# Patient Record
Sex: Male | Born: 1940
Health system: Southern US, Community
[De-identification: ages and names within clinical notes are randomized; demographics above are authoritative.]

## PROBLEM LIST (undated history)

## (undated) DIAGNOSIS — I10 Essential (primary) hypertension: Secondary | ICD-10-CM

## (undated) DIAGNOSIS — N189 Chronic kidney disease, unspecified: Secondary | ICD-10-CM

## (undated) DIAGNOSIS — R001 Bradycardia, unspecified: Secondary | ICD-10-CM

## (undated) DIAGNOSIS — E785 Hyperlipidemia, unspecified: Secondary | ICD-10-CM

## (undated) DIAGNOSIS — M1990 Unspecified osteoarthritis, unspecified site: Secondary | ICD-10-CM

## (undated) DIAGNOSIS — G4730 Sleep apnea, unspecified: Secondary | ICD-10-CM

## (undated) HISTORY — PX: OTHER SURGICAL HISTORY: SHX169

## (undated) HISTORY — PX: COLONOSCOPY: SHX174

---

## 2013-01-11 DIAGNOSIS — I10 Essential (primary) hypertension: Secondary | ICD-10-CM | POA: Insufficient documentation

## 2013-01-11 DIAGNOSIS — E785 Hyperlipidemia, unspecified: Secondary | ICD-10-CM | POA: Insufficient documentation

## 2013-05-13 DIAGNOSIS — G4733 Obstructive sleep apnea (adult) (pediatric): Secondary | ICD-10-CM | POA: Insufficient documentation

## 2013-12-10 DIAGNOSIS — M545 Low back pain, unspecified: Secondary | ICD-10-CM | POA: Insufficient documentation

## 2014-01-27 DIAGNOSIS — L0212 Furuncle of neck: Secondary | ICD-10-CM | POA: Insufficient documentation

## 2014-02-06 DIAGNOSIS — N289 Disorder of kidney and ureter, unspecified: Secondary | ICD-10-CM | POA: Insufficient documentation

## 2016-03-01 DIAGNOSIS — G4762 Sleep related leg cramps: Secondary | ICD-10-CM | POA: Insufficient documentation

## 2016-07-15 DIAGNOSIS — Z125 Encounter for screening for malignant neoplasm of prostate: Secondary | ICD-10-CM | POA: Insufficient documentation

## 2016-07-15 DIAGNOSIS — M25511 Pain in right shoulder: Secondary | ICD-10-CM

## 2016-07-15 DIAGNOSIS — G8929 Other chronic pain: Secondary | ICD-10-CM | POA: Insufficient documentation

## 2016-07-15 DIAGNOSIS — Z79899 Other long term (current) drug therapy: Secondary | ICD-10-CM | POA: Insufficient documentation

## 2016-09-02 DIAGNOSIS — Z6825 Body mass index (BMI) 25.0-25.9, adult: Secondary | ICD-10-CM | POA: Diagnosis not present

## 2016-09-02 DIAGNOSIS — M7521 Bicipital tendinitis, right shoulder: Secondary | ICD-10-CM | POA: Diagnosis not present

## 2016-09-02 DIAGNOSIS — M19011 Primary osteoarthritis, right shoulder: Secondary | ICD-10-CM | POA: Diagnosis not present

## 2016-09-14 DIAGNOSIS — Z125 Encounter for screening for malignant neoplasm of prostate: Secondary | ICD-10-CM | POA: Diagnosis not present

## 2016-09-14 DIAGNOSIS — E7800 Pure hypercholesterolemia, unspecified: Secondary | ICD-10-CM | POA: Diagnosis not present

## 2016-09-14 DIAGNOSIS — M545 Low back pain: Secondary | ICD-10-CM | POA: Diagnosis not present

## 2016-09-14 DIAGNOSIS — Z79899 Other long term (current) drug therapy: Secondary | ICD-10-CM | POA: Diagnosis not present

## 2016-09-14 DIAGNOSIS — Z6824 Body mass index (BMI) 24.0-24.9, adult: Secondary | ICD-10-CM | POA: Diagnosis not present

## 2016-09-14 DIAGNOSIS — G4733 Obstructive sleep apnea (adult) (pediatric): Secondary | ICD-10-CM | POA: Diagnosis not present

## 2016-09-14 DIAGNOSIS — I10 Essential (primary) hypertension: Secondary | ICD-10-CM | POA: Diagnosis not present

## 2016-11-21 DIAGNOSIS — M25511 Pain in right shoulder: Secondary | ICD-10-CM | POA: Diagnosis not present

## 2016-11-21 DIAGNOSIS — E785 Hyperlipidemia, unspecified: Secondary | ICD-10-CM | POA: Diagnosis not present

## 2016-11-21 DIAGNOSIS — G4733 Obstructive sleep apnea (adult) (pediatric): Secondary | ICD-10-CM | POA: Diagnosis not present

## 2016-11-21 DIAGNOSIS — G8929 Other chronic pain: Secondary | ICD-10-CM | POA: Diagnosis not present

## 2016-11-21 DIAGNOSIS — Z6824 Body mass index (BMI) 24.0-24.9, adult: Secondary | ICD-10-CM | POA: Diagnosis not present

## 2016-11-21 DIAGNOSIS — I10 Essential (primary) hypertension: Secondary | ICD-10-CM | POA: Diagnosis not present

## 2016-11-21 DIAGNOSIS — N289 Disorder of kidney and ureter, unspecified: Secondary | ICD-10-CM | POA: Diagnosis not present

## 2016-12-07 DIAGNOSIS — Z6824 Body mass index (BMI) 24.0-24.9, adult: Secondary | ICD-10-CM | POA: Diagnosis not present

## 2016-12-07 DIAGNOSIS — M19011 Primary osteoarthritis, right shoulder: Secondary | ICD-10-CM | POA: Diagnosis not present

## 2016-12-07 DIAGNOSIS — M19012 Primary osteoarthritis, left shoulder: Secondary | ICD-10-CM | POA: Diagnosis not present

## 2016-12-07 DIAGNOSIS — M25512 Pain in left shoulder: Secondary | ICD-10-CM | POA: Diagnosis not present

## 2016-12-07 DIAGNOSIS — G8929 Other chronic pain: Secondary | ICD-10-CM | POA: Diagnosis not present

## 2016-12-07 DIAGNOSIS — M25511 Pain in right shoulder: Secondary | ICD-10-CM | POA: Diagnosis not present

## 2016-12-09 DIAGNOSIS — M7521 Bicipital tendinitis, right shoulder: Secondary | ICD-10-CM | POA: Diagnosis not present

## 2016-12-09 DIAGNOSIS — Z6824 Body mass index (BMI) 24.0-24.9, adult: Secondary | ICD-10-CM | POA: Diagnosis not present

## 2016-12-09 DIAGNOSIS — M19011 Primary osteoarthritis, right shoulder: Secondary | ICD-10-CM | POA: Diagnosis not present

## 2016-12-19 DIAGNOSIS — H2513 Age-related nuclear cataract, bilateral: Secondary | ICD-10-CM | POA: Diagnosis not present

## 2017-02-07 DIAGNOSIS — G4762 Sleep related leg cramps: Secondary | ICD-10-CM | POA: Diagnosis not present

## 2017-02-07 DIAGNOSIS — Z6824 Body mass index (BMI) 24.0-24.9, adult: Secondary | ICD-10-CM | POA: Diagnosis not present

## 2017-03-27 DIAGNOSIS — R42 Dizziness and giddiness: Secondary | ICD-10-CM | POA: Diagnosis not present

## 2017-03-27 DIAGNOSIS — R001 Bradycardia, unspecified: Secondary | ICD-10-CM | POA: Insufficient documentation

## 2017-03-27 DIAGNOSIS — Z6824 Body mass index (BMI) 24.0-24.9, adult: Secondary | ICD-10-CM | POA: Diagnosis not present

## 2017-03-27 DIAGNOSIS — E785 Hyperlipidemia, unspecified: Secondary | ICD-10-CM | POA: Diagnosis not present

## 2017-03-27 DIAGNOSIS — N289 Disorder of kidney and ureter, unspecified: Secondary | ICD-10-CM | POA: Diagnosis not present

## 2017-03-27 DIAGNOSIS — M25519 Pain in unspecified shoulder: Secondary | ICD-10-CM | POA: Diagnosis not present

## 2017-03-27 DIAGNOSIS — G4733 Obstructive sleep apnea (adult) (pediatric): Secondary | ICD-10-CM | POA: Diagnosis not present

## 2017-03-27 DIAGNOSIS — I10 Essential (primary) hypertension: Secondary | ICD-10-CM | POA: Diagnosis not present

## 2017-04-05 DIAGNOSIS — R42 Dizziness and giddiness: Secondary | ICD-10-CM | POA: Diagnosis not present

## 2017-04-05 DIAGNOSIS — Z6824 Body mass index (BMI) 24.0-24.9, adult: Secondary | ICD-10-CM | POA: Diagnosis not present

## 2017-05-01 DIAGNOSIS — Z6825 Body mass index (BMI) 25.0-25.9, adult: Secondary | ICD-10-CM | POA: Diagnosis not present

## 2017-05-01 DIAGNOSIS — E7800 Pure hypercholesterolemia, unspecified: Secondary | ICD-10-CM | POA: Diagnosis not present

## 2017-05-01 DIAGNOSIS — Z79899 Other long term (current) drug therapy: Secondary | ICD-10-CM | POA: Diagnosis not present

## 2017-05-01 DIAGNOSIS — G4733 Obstructive sleep apnea (adult) (pediatric): Secondary | ICD-10-CM | POA: Diagnosis not present

## 2017-05-01 DIAGNOSIS — I10 Essential (primary) hypertension: Secondary | ICD-10-CM | POA: Diagnosis not present

## 2017-05-01 DIAGNOSIS — M545 Low back pain: Secondary | ICD-10-CM | POA: Diagnosis not present

## 2017-05-15 DIAGNOSIS — Z136 Encounter for screening for cardiovascular disorders: Secondary | ICD-10-CM | POA: Diagnosis not present

## 2017-05-15 DIAGNOSIS — Z125 Encounter for screening for malignant neoplasm of prostate: Secondary | ICD-10-CM | POA: Diagnosis not present

## 2017-05-15 DIAGNOSIS — Z9181 History of falling: Secondary | ICD-10-CM | POA: Diagnosis not present

## 2017-05-15 DIAGNOSIS — Z139 Encounter for screening, unspecified: Secondary | ICD-10-CM | POA: Diagnosis not present

## 2017-05-15 DIAGNOSIS — Z1389 Encounter for screening for other disorder: Secondary | ICD-10-CM | POA: Diagnosis not present

## 2017-05-15 DIAGNOSIS — Z0000 Encounter for general adult medical examination without abnormal findings: Secondary | ICD-10-CM | POA: Diagnosis not present

## 2017-05-15 DIAGNOSIS — Z23 Encounter for immunization: Secondary | ICD-10-CM | POA: Diagnosis not present

## 2017-05-15 DIAGNOSIS — E785 Hyperlipidemia, unspecified: Secondary | ICD-10-CM | POA: Diagnosis not present

## 2017-06-14 DIAGNOSIS — M25512 Pain in left shoulder: Secondary | ICD-10-CM | POA: Diagnosis not present

## 2017-06-14 DIAGNOSIS — M19012 Primary osteoarthritis, left shoulder: Secondary | ICD-10-CM | POA: Diagnosis not present

## 2017-06-14 DIAGNOSIS — M25511 Pain in right shoulder: Secondary | ICD-10-CM | POA: Diagnosis not present

## 2017-06-14 DIAGNOSIS — Z6824 Body mass index (BMI) 24.0-24.9, adult: Secondary | ICD-10-CM | POA: Diagnosis not present

## 2017-06-14 DIAGNOSIS — G8929 Other chronic pain: Secondary | ICD-10-CM | POA: Diagnosis not present

## 2017-06-16 DIAGNOSIS — M19012 Primary osteoarthritis, left shoulder: Secondary | ICD-10-CM | POA: Diagnosis not present

## 2017-06-16 DIAGNOSIS — M25512 Pain in left shoulder: Secondary | ICD-10-CM | POA: Diagnosis not present

## 2017-07-05 DIAGNOSIS — G4733 Obstructive sleep apnea (adult) (pediatric): Secondary | ICD-10-CM | POA: Diagnosis not present

## 2017-07-05 DIAGNOSIS — Z6825 Body mass index (BMI) 25.0-25.9, adult: Secondary | ICD-10-CM | POA: Diagnosis not present

## 2017-07-05 DIAGNOSIS — R001 Bradycardia, unspecified: Secondary | ICD-10-CM | POA: Diagnosis not present

## 2017-07-05 DIAGNOSIS — N289 Disorder of kidney and ureter, unspecified: Secondary | ICD-10-CM | POA: Diagnosis not present

## 2017-07-05 DIAGNOSIS — E785 Hyperlipidemia, unspecified: Secondary | ICD-10-CM | POA: Diagnosis not present

## 2017-07-05 DIAGNOSIS — I10 Essential (primary) hypertension: Secondary | ICD-10-CM | POA: Diagnosis not present

## 2017-07-19 DIAGNOSIS — M25512 Pain in left shoulder: Secondary | ICD-10-CM | POA: Diagnosis not present

## 2017-07-20 DIAGNOSIS — M19011 Primary osteoarthritis, right shoulder: Secondary | ICD-10-CM | POA: Diagnosis not present

## 2017-07-20 DIAGNOSIS — M19012 Primary osteoarthritis, left shoulder: Secondary | ICD-10-CM | POA: Diagnosis not present

## 2017-08-08 HISTORY — PX: JOINT REPLACEMENT: SHX530

## 2017-08-28 DIAGNOSIS — M19012 Primary osteoarthritis, left shoulder: Secondary | ICD-10-CM | POA: Diagnosis not present

## 2017-08-29 DIAGNOSIS — M19019 Primary osteoarthritis, unspecified shoulder: Secondary | ICD-10-CM | POA: Insufficient documentation

## 2017-08-30 ENCOUNTER — Encounter
Admission: RE | Admit: 2017-08-30 | Discharge: 2017-08-30 | Disposition: A | Discharge status: Home / Self Care | Payer: Medicare Other | Source: Ambulatory Visit | Attending: Orthopedic Surgery | Admitting: Orthopedic Surgery

## 2017-08-30 ENCOUNTER — Other Ambulatory Visit: Payer: Self-pay

## 2017-08-30 DIAGNOSIS — Z01812 Encounter for preprocedural laboratory examination: Secondary | ICD-10-CM | POA: Insufficient documentation

## 2017-08-30 HISTORY — DX: Hyperlipidemia, unspecified: E78.5

## 2017-08-30 HISTORY — DX: Chronic kidney disease, unspecified: N18.9

## 2017-08-30 HISTORY — DX: Bradycardia, unspecified: R00.1

## 2017-08-30 HISTORY — DX: Unspecified osteoarthritis, unspecified site: M19.90

## 2017-08-30 HISTORY — DX: Sleep apnea, unspecified: G47.30

## 2017-08-30 HISTORY — DX: Essential (primary) hypertension: I10

## 2017-08-30 LAB — DIFFERENTIAL
BASOS PCT: 1 %
Basophils Absolute: 0 10*3/uL (ref 0–0.1)
EOS ABS: 0.2 10*3/uL (ref 0–0.7)
EOS PCT: 3 %
Lymphocytes Relative: 18 %
Lymphs Abs: 1.1 10*3/uL (ref 1.0–3.6)
MONOS PCT: 9 %
Monocytes Absolute: 0.5 10*3/uL (ref 0.2–1.0)
Neutro Abs: 4.1 10*3/uL (ref 1.4–6.5)
Neutrophils Relative %: 69 %

## 2017-08-30 LAB — BASIC METABOLIC PANEL
Anion gap: 8 (ref 5–15)
BUN: 15 mg/dL (ref 6–20)
CO2: 26 mmol/L (ref 22–32)
CREATININE: 0.77 mg/dL (ref 0.61–1.24)
Calcium: 9.4 mg/dL (ref 8.9–10.3)
Chloride: 104 mmol/L (ref 101–111)
GFR calc Af Amer: >60 mL/min (ref >60)
GLUCOSE: 119 mg/dL — AB (ref 65–99)
Potassium: 3.9 mmol/L (ref 3.5–5.1)
SODIUM: 138 mmol/L (ref 135–145)

## 2017-08-30 LAB — URINALYSIS, ROUTINE W REFLEX MICROSCOPIC
BILIRUBIN URINE: NEGATIVE
GLUCOSE, UA: 50 mg/dL — AB
Hgb urine dipstick: NEGATIVE
Ketones, ur: NEGATIVE mg/dL
Leukocytes, UA: NEGATIVE
Nitrite: NEGATIVE
PH: 7 (ref 5.0–8.0)
Protein, ur: NEGATIVE mg/dL
SPECIFIC GRAVITY, URINE: 1.009 (ref 1.005–1.030)

## 2017-08-30 LAB — TYPE AND SCREEN
ABO/RH(D): O POS
ANTIBODY SCREEN: NEGATIVE

## 2017-08-30 LAB — SURGICAL PCR SCREEN
MRSA, PCR: NEGATIVE
Staphylococcus aureus: NEGATIVE

## 2017-08-30 LAB — CBC
HCT: 40.7 % (ref 40.0–52.0)
Hemoglobin: 14.1 g/dL (ref 13.0–18.0)
MCH: 31.5 pg (ref 26.0–34.0)
MCHC: 34.6 g/dL (ref 32.0–36.0)
MCV: 91.1 fL (ref 80.0–100.0)
PLATELETS: 177 10*3/uL (ref 150–440)
RBC: 4.47 MIL/uL (ref 4.40–5.90)
RDW: 13.1 % (ref 11.5–14.5)
WBC: 5.8 10*3/uL (ref 3.8–10.6)

## 2017-08-30 LAB — PROTIME-INR
INR: 0.92
PROTHROMBIN TIME: 12.3 s (ref 11.4–15.2)

## 2017-08-30 NOTE — Patient Instructions (Signed)
Your procedure is scheduled on: Monday 09/11/17 Report to DAY SURGERY. 2ND FLOOR MEDICAL MALL ENTRANCE. To find out your arrival time please call [phone] between 1PM - 3PM on Friday 09/08/17.  Remember: Instructions that are not followed completely may result in serious medical risk, up to and including death, or upon the discretion of your surgeon and anesthesiologist your surgery may need to be rescheduled.    __X__ 1. Do not eat anything after midnight the night before your    procedure.  No gum chewing or hard candies.  You may drink clear   liquids up to 2 hours before you are scheduled to arrive at the   hospital for your procedure. Do not drink clear liquids within 2   hours of scheduled arrival to the hospital as this may lead to your   procedure being delayed or rescheduled.       Clear liquids include:   Water or Apple juice without pulp   Clear carbohydrate beverage such as Clearfast or Gatorade   Black coffee or Clear Tea (no milk, no creamer, do not add anything   to the coffee or tea)    Diabetics should only drink water   __X__ 2. No Alcohol for 24 hours before or after surgery.   ____ 3. Bring all medications with you on the day of surgery if instructed.    __X__ 4. Notify your doctor if there is any change in your medical condition     (cold, fever, infections).             __X___5. No smoking within 24 hours of your surgery.     Do not wear jewelry, make-up, hairpins, clips or nail polish.  Do not wear lotions, powders, or perfumes.   Do not shave 48 hours prior to surgery. Men may shave face and neck.  Do not bring valuables to the hospital.    [facility] any belongings or valuables.               Contacts, dentures or bridgework may not be worn into surgery.  Leave your suitcase in the car. After surgery it may be brought to your room.  For patients admitted to the hospital, discharge time is determined by your                treatment  team.   Patients discharged the day of surgery will not be allowed to drive home.   Please read over the following fact sheets that you were given:   MRSA Information   _X___ Take these medicines the morning of surgery with A SIP OF WATER:    1. NEED TO CALL BACK WITH MEDICATION LIST  2.   3.   4.  5.  6.  ____ Fleet Enema (as directed)   __X__ Use CHG Soap/SAGE wipes as directed  ____ Use inhalers on the day of surgery  ____ Stop metformin 2 days prior to surgery    ____ Take 1/2 of usual insulin dose the night before surgery and none on the morning of surgery.   __X__ Stop Coumadin/Plavix/aspirin on 09/04/17  __X__ Stop Anti-inflammatories 1 WEEK BEFORE SURGERY such as Advil, Aleve, Ibuprofen, Motrin, Naproxen, Naprosyn, Goodies,powder, Meloxicam or aspirin products.  OK to take Tylenol.   __X__ Stop supplements, Vitamin E, Fish Oil until after surgery.  ASLO STOP MELATONIN AND GARLIC  __X__ Bring C-Pap to the hospital.

## 2017-08-31 NOTE — Patient Instructions (Signed)
Your procedure is scheduled on: 09/11/17 Report to Day Surgery. MEDICAL MALL TO SECOND FLOOR To find out your arrival time please call [phone] between 1PM - 3PM on Friday 09/08/17  Remember: Instructions that are not followed completely may result in serious medical risk, up to and including death, or upon the discretion of your surgeon and anesthesiologist your surgery may need to be rescheduled.     _X__ 1. Do not eat food after midnight the night before your procedure.                 No gum chewing or hard candies. You may drink clear liquids up to 2 hours                 before you are scheduled to arrive for your surgery- DO not drink clear                 liquids within 2 hours of the start of your surgery.                 Clear Liquids include:  water, apple juice without pulp, clear carbohydrate                 drink such as Clearfast of Gartorade, Black Coffee or Tea (Do not add                 anything to coffee or tea).     _X__ 2.  No Alcohol for 24 hours before or after surgery.   _X__ 3.  Do Not Smoke or use e-cigarettes For 24 Hours Prior to Your Surgery.                 Do not use any chewable tobacco products for at least 6 hours prior to                 surgery.  ____  4.  Bring all medications with you on the day of surgery if instructed.   _X___  5.  Notify your doctor if there is any change in your medical condition      (cold, fever, infections).     Do not wear jewelry, make-up, hairpins, clips or nail polish. Do not wear lotions, powders, or perfumes. You may wear deodorant. Do not shave 48 hours prior to surgery. Men may shave face and neck. Do not bring valuables to the hospital.    [facility] is not responsible for any belongings or valuables.  Contacts, dentures or bridgework may not be worn into surgery. Leave your suitcase in the car. After surgery it may be brought to your room. For patients admitted to the hospital,  discharge time is determined by your treatment team.   Patients discharged the day of surgery will not be allowed to drive home.   Please read over the following fact sheets that you were given:   MRSA Information  _X___ Take these medicines the morning of surgery with A SIP OF WATER:    1. LOSARTAN  2. TRAMADOL  3.   4.  5.  6.  ____ Fleet Enema (as directed)   __X__ Use CHG Soap as directed  ____ Use inhalers on the day of surgery  ____ Stop metformin 2 days prior to surgery    ____ Take 1/2 of usual insulin dose the night before surgery. No insulin the morning          of surgery.   _X___ Stop Coumadin/Plavix/aspirin  on  STOP ASPIRIN ON 09/04/17  __X__ Stop Anti-inflammatories on  1 WEEK BEFORE SURGERY  ADVIL,MELOXICAM,  __X__ Stop supplements until after surgery.  STOP FISH OIL,GARLIC,MELATONIN 1 WEEK BEFORE SURGERY  __X__ Bring C-Pap to the hospital.   PRINTED AFTER MEDICATION VERIFICATION AND INSTRUCTIONS REVIEWED WITH DAUGHTER JANIE  08/31/17 0730 BY S Tanee Henery RN

## 2017-09-05 ENCOUNTER — Ambulatory Visit: Payer: Self-pay | Admitting: Orthopedic Surgery

## 2017-09-08 DIAGNOSIS — Z01818 Encounter for other preprocedural examination: Secondary | ICD-10-CM | POA: Diagnosis not present

## 2017-09-08 DIAGNOSIS — Z6824 Body mass index (BMI) 24.0-24.9, adult: Secondary | ICD-10-CM | POA: Diagnosis not present

## 2017-09-10 MED ORDER — CEFAZOLIN SODIUM-DEXTROSE 2-4 GM/100ML-% IV SOLN
2.0000 g | INTRAVENOUS | Status: AC
  Administered 2017-09-11: 2 g via INTRAVENOUS

## 2017-09-10 MED ORDER — TRANEXAMIC ACID 1000 MG/10ML IV SOLN
1000.0000 mg | INTRAVENOUS | Status: DC
  Filled 2017-09-10: qty 10

## 2017-09-11 ENCOUNTER — Other Ambulatory Visit: Payer: Self-pay

## 2017-09-11 ENCOUNTER — Inpatient Hospital Stay: Payer: Medicare Other | Admitting: Anesthesiology

## 2017-09-11 ENCOUNTER — Encounter
Admission: RE | Disposition: A | Discharge status: Home / Self Care | Payer: Self-pay | Source: Ambulatory Visit | Attending: Orthopedic Surgery

## 2017-09-11 ENCOUNTER — Inpatient Hospital Stay
Admission: RE | Admit: 2017-09-11 | Discharge: 2017-09-14 | DRG: 483 | Disposition: A | Discharge status: Home / Self Care | Payer: Medicare Other | Source: Ambulatory Visit | Attending: Orthopedic Surgery | Admitting: Orthopedic Surgery

## 2017-09-11 ENCOUNTER — Inpatient Hospital Stay: Payer: Medicare Other

## 2017-09-11 ENCOUNTER — Encounter: Payer: Self-pay | Admitting: *Deleted

## 2017-09-11 DIAGNOSIS — N400 Enlarged prostate without lower urinary tract symptoms: Secondary | ICD-10-CM | POA: Diagnosis present

## 2017-09-11 DIAGNOSIS — M25512 Pain in left shoulder: Secondary | ICD-10-CM | POA: Diagnosis not present

## 2017-09-11 DIAGNOSIS — G8918 Other acute postprocedural pain: Secondary | ICD-10-CM | POA: Diagnosis not present

## 2017-09-11 DIAGNOSIS — Z791 Long term (current) use of non-steroidal anti-inflammatories (NSAID): Secondary | ICD-10-CM | POA: Diagnosis not present

## 2017-09-11 DIAGNOSIS — J9811 Atelectasis: Secondary | ICD-10-CM | POA: Diagnosis not present

## 2017-09-11 DIAGNOSIS — Z87891 Personal history of nicotine dependence: Secondary | ICD-10-CM | POA: Diagnosis not present

## 2017-09-11 DIAGNOSIS — J9312 Secondary spontaneous pneumothorax: Secondary | ICD-10-CM | POA: Diagnosis not present

## 2017-09-11 DIAGNOSIS — Z96612 Presence of left artificial shoulder joint: Secondary | ICD-10-CM

## 2017-09-11 DIAGNOSIS — Z4682 Encounter for fitting and adjustment of non-vascular catheter: Secondary | ICD-10-CM | POA: Diagnosis not present

## 2017-09-11 DIAGNOSIS — G4733 Obstructive sleep apnea (adult) (pediatric): Secondary | ICD-10-CM | POA: Diagnosis present

## 2017-09-11 DIAGNOSIS — Z800 Family history of malignant neoplasm of digestive organs: Secondary | ICD-10-CM | POA: Diagnosis not present

## 2017-09-11 DIAGNOSIS — G4730 Sleep apnea, unspecified: Secondary | ICD-10-CM | POA: Diagnosis not present

## 2017-09-11 DIAGNOSIS — E785 Hyperlipidemia, unspecified: Secondary | ICD-10-CM | POA: Diagnosis present

## 2017-09-11 DIAGNOSIS — Z09 Encounter for follow-up examination after completed treatment for conditions other than malignant neoplasm: Secondary | ICD-10-CM

## 2017-09-11 DIAGNOSIS — Z8249 Family history of ischemic heart disease and other diseases of the circulatory system: Secondary | ICD-10-CM | POA: Diagnosis not present

## 2017-09-11 DIAGNOSIS — I129 Hypertensive chronic kidney disease with stage 1 through stage 4 chronic kidney disease, or unspecified chronic kidney disease: Secondary | ICD-10-CM | POA: Diagnosis present

## 2017-09-11 DIAGNOSIS — Z7982 Long term (current) use of aspirin: Secondary | ICD-10-CM | POA: Diagnosis not present

## 2017-09-11 DIAGNOSIS — G2581 Restless legs syndrome: Secondary | ICD-10-CM | POA: Diagnosis present

## 2017-09-11 DIAGNOSIS — R079 Chest pain, unspecified: Secondary | ICD-10-CM | POA: Diagnosis not present

## 2017-09-11 DIAGNOSIS — J939 Pneumothorax, unspecified: Secondary | ICD-10-CM

## 2017-09-11 DIAGNOSIS — N189 Chronic kidney disease, unspecified: Secondary | ICD-10-CM | POA: Diagnosis not present

## 2017-09-11 DIAGNOSIS — J9383 Other pneumothorax: Secondary | ICD-10-CM | POA: Diagnosis not present

## 2017-09-11 DIAGNOSIS — N182 Chronic kidney disease, stage 2 (mild): Secondary | ICD-10-CM | POA: Diagnosis present

## 2017-09-11 DIAGNOSIS — Z471 Aftercare following joint replacement surgery: Secondary | ICD-10-CM | POA: Diagnosis not present

## 2017-09-11 DIAGNOSIS — M19012 Primary osteoarthritis, left shoulder: Principal | ICD-10-CM | POA: Diagnosis present

## 2017-09-11 DIAGNOSIS — J95811 Postprocedural pneumothorax: Secondary | ICD-10-CM

## 2017-09-11 DIAGNOSIS — I10 Essential (primary) hypertension: Secondary | ICD-10-CM | POA: Diagnosis not present

## 2017-09-11 HISTORY — PX: TOTAL SHOULDER ARTHROPLASTY: SHX126

## 2017-09-11 LAB — ABO/RH: ABO/RH(D): O POS

## 2017-09-11 SURGERY — ARTHROPLASTY, SHOULDER, TOTAL
Anesthesia: General | Laterality: Left

## 2017-09-11 MED ORDER — BACITRACIN 50000 UNITS IM SOLR
INTRAMUSCULAR | Status: AC
  Filled 2017-09-11: qty 2

## 2017-09-11 MED ORDER — METOCLOPRAMIDE HCL 5 MG/ML IJ SOLN: 5.0000 mg | Freq: Three times a day (TID) | INTRAMUSCULAR | Status: DC | PRN

## 2017-09-11 MED ORDER — MAGNESIUM CITRATE PO SOLN
1.0000 | Freq: Once | ORAL | Status: DC | PRN
  Filled 2017-09-11: qty 296

## 2017-09-11 MED ORDER — MORPHINE SULFATE (PF) 4 MG/ML IV SOLN
2.0000 mg | Freq: Once | INTRAVENOUS | Status: AC
  Administered 2017-09-11: 2 mg via INTRAVENOUS

## 2017-09-11 MED ORDER — ACETAMINOPHEN 500 MG PO TABS
ORAL_TABLET | ORAL | Status: AC
  Filled 2017-09-11: qty 2

## 2017-09-11 MED ORDER — CEFAZOLIN SODIUM-DEXTROSE 2-4 GM/100ML-% IV SOLN
INTRAVENOUS | Status: AC
  Filled 2017-09-11: qty 100

## 2017-09-11 MED ORDER — PHENOL 1.4 % MT LIQD
1.0000 | OROMUCOSAL | Status: DC | PRN
  Filled 2017-09-11: qty 177

## 2017-09-11 MED ORDER — SUGAMMADEX SODIUM 500 MG/5ML IV SOLN
INTRAVENOUS | Status: DC | PRN
  Administered 2017-09-11: 200 mg via INTRAVENOUS

## 2017-09-11 MED ORDER — CELECOXIB 200 MG PO CAPS
200.0000 mg | ORAL_CAPSULE | Freq: Two times a day (BID) | ORAL | Status: DC
  Administered 2017-09-11 – 2017-09-14 (×6): 200 mg via ORAL
  Filled 2017-09-11 (×7): qty 1

## 2017-09-11 MED ORDER — LOSARTAN POTASSIUM 50 MG PO TABS
100.0000 mg | ORAL_TABLET | Freq: Every day | ORAL | Status: DC
  Administered 2017-09-12 – 2017-09-14 (×3): 100 mg via ORAL
  Filled 2017-09-11 (×3): qty 2

## 2017-09-11 MED ORDER — SENNA 8.6 MG PO TABS
1.0000 | ORAL_TABLET | Freq: Two times a day (BID) | ORAL | Status: DC
  Administered 2017-09-11 – 2017-09-14 (×5): 8.6 mg via ORAL
  Filled 2017-09-11 (×6): qty 1

## 2017-09-11 MED ORDER — FENTANYL CITRATE (PF) 100 MCG/2ML IJ SOLN
INTRAMUSCULAR | Status: AC
  Filled 2017-09-11: qty 4

## 2017-09-11 MED ORDER — ACETAMINOPHEN 500 MG PO TABS
1000.0000 mg | ORAL_TABLET | Freq: Once | ORAL | Status: AC
  Administered 2017-09-11: 1000 mg via ORAL

## 2017-09-11 MED ORDER — SODIUM CHLORIDE FLUSH 0.9 % IV SOLN
INTRAVENOUS | Status: AC
  Filled 2017-09-11: qty 20

## 2017-09-11 MED ORDER — FENTANYL CITRATE (PF) 100 MCG/2ML IJ SOLN
50.0000 ug | Freq: Once | INTRAMUSCULAR | Status: AC
  Administered 2017-09-11: 50 ug via INTRAVENOUS

## 2017-09-11 MED ORDER — HYDROCODONE-ACETAMINOPHEN 5-325 MG PO TABS
1.0000 | ORAL_TABLET | ORAL | Status: DC | PRN
  Administered 2017-09-11 – 2017-09-14 (×7): 1 via ORAL
  Filled 2017-09-11 (×7): qty 1

## 2017-09-11 MED ORDER — LACTATED RINGERS IV SOLN
INTRAVENOUS | Status: DC
  Administered 2017-09-11 (×3): via INTRAVENOUS

## 2017-09-11 MED ORDER — ASPIRIN EC 81 MG PO TBEC
81.0000 mg | DELAYED_RELEASE_TABLET | Freq: Every day | ORAL | Status: DC
  Administered 2017-09-11 – 2017-09-14 (×4): 81 mg via ORAL
  Filled 2017-09-11 (×4): qty 1

## 2017-09-11 MED ORDER — BUPIVACAINE-EPINEPHRINE (PF) 0.25% -1:200000 IJ SOLN: INTRAMUSCULAR | Status: DC | PRN

## 2017-09-11 MED ORDER — MORPHINE SULFATE (PF) 2 MG/ML IV SOLN: 2.0000 mg | Freq: Once | INTRAVENOUS | Status: DC

## 2017-09-11 MED ORDER — ACETAMINOPHEN 325 MG PO TABS: 650.0000 mg | ORAL_TABLET | ORAL | Status: DC | PRN

## 2017-09-11 MED ORDER — METOCLOPRAMIDE HCL 5 MG PO TABS: 5.0000 mg | ORAL_TABLET | Freq: Three times a day (TID) | ORAL | Status: DC | PRN

## 2017-09-11 MED ORDER — PHENYLEPHRINE HCL 10 MG/ML IJ SOLN
INTRAMUSCULAR | Status: DC | PRN
  Administered 2017-09-11: 50 ug via INTRAVENOUS
  Administered 2017-09-11: 100 ug via INTRAVENOUS
  Administered 2017-09-11 (×2): 50 ug via INTRAVENOUS

## 2017-09-11 MED ORDER — PROPOFOL 10 MG/ML IV BOLUS
INTRAVENOUS | Status: DC | PRN
  Administered 2017-09-11: 140 mg via INTRAVENOUS

## 2017-09-11 MED ORDER — CEFAZOLIN SODIUM 1 G IJ SOLR
Freq: Four times a day (QID) | INTRAMUSCULAR | Status: AC
  Administered 2017-09-11 – 2017-09-12 (×3): via INTRAVENOUS
  Filled 2017-09-11 (×3): qty 10

## 2017-09-11 MED ORDER — MORPHINE SULFATE (PF) 2 MG/ML IV SOLN
2.0000 mg | INTRAVENOUS | Status: DC | PRN
  Administered 2017-09-11: 2 mg via INTRAVENOUS

## 2017-09-11 MED ORDER — DOCUSATE SODIUM 100 MG PO CAPS
100.0000 mg | ORAL_CAPSULE | Freq: Two times a day (BID) | ORAL | Status: DC
  Administered 2017-09-11 – 2017-09-14 (×5): 100 mg via ORAL
  Filled 2017-09-11 (×6): qty 1

## 2017-09-11 MED ORDER — ONDANSETRON HCL 4 MG/2ML IJ SOLN
INTRAMUSCULAR | Status: DC | PRN
  Administered 2017-09-11: 4 mg via INTRAVENOUS

## 2017-09-11 MED ORDER — BUPIVACAINE-EPINEPHRINE (PF) 0.25% -1:200000 IJ SOLN
INTRAMUSCULAR | Status: AC
  Filled 2017-09-11: qty 30

## 2017-09-11 MED ORDER — LIDOCAINE HCL (CARDIAC) 20 MG/ML IV SOLN
INTRAVENOUS | Status: DC | PRN
  Administered 2017-09-11: 50 mg via INTRAVENOUS

## 2017-09-11 MED ORDER — LIDOCAINE HCL (PF) 1 % IJ SOLN
INTRAMUSCULAR | Status: DC | PRN
  Administered 2017-09-11: .8 mL via SUBCUTANEOUS

## 2017-09-11 MED ORDER — TRANEXAMIC ACID 1000 MG/10ML IV SOLN
INTRAVENOUS | Status: DC | PRN
  Administered 2017-09-11: 1000 mg via INTRAVENOUS

## 2017-09-11 MED ORDER — LACTATED RINGERS IV SOLN: INTRAVENOUS | Status: DC

## 2017-09-11 MED ORDER — GABAPENTIN 300 MG PO CAPS
ORAL_CAPSULE | ORAL | Status: AC
  Filled 2017-09-11: qty 1

## 2017-09-11 MED ORDER — HYDROCODONE-ACETAMINOPHEN 5-325 MG PO TABS
2.0000 | ORAL_TABLET | ORAL | Status: DC | PRN
  Administered 2017-09-11: 2 via ORAL
  Filled 2017-09-11: qty 2

## 2017-09-11 MED ORDER — MENTHOL 3 MG MT LOZG
1.0000 | LOZENGE | OROMUCOSAL | Status: DC | PRN
  Filled 2017-09-11: qty 9

## 2017-09-11 MED ORDER — MAGNESIUM HYDROXIDE 400 MG/5ML PO SUSP
30.0000 mL | Freq: Every day | ORAL | Status: DC | PRN
  Administered 2017-09-13: 30 mL via ORAL
  Filled 2017-09-11 (×2): qty 30

## 2017-09-11 MED ORDER — MIDAZOLAM HCL 5 MG/5ML IJ SOLN
INTRAMUSCULAR | Status: AC
  Filled 2017-09-11: qty 5

## 2017-09-11 MED ORDER — MIDAZOLAM HCL 2 MG/2ML IJ SOLN
1.0000 mg | Freq: Once | INTRAMUSCULAR | Status: AC
  Administered 2017-09-11: 1 mg via INTRAVENOUS

## 2017-09-11 MED ORDER — ROPINIROLE HCL 0.25 MG PO TABS
0.2500 mg | ORAL_TABLET | Freq: Every day | ORAL | Status: DC
  Administered 2017-09-11 – 2017-09-13 (×3): 0.25 mg via ORAL
  Filled 2017-09-11 (×4): qty 1

## 2017-09-11 MED ORDER — FAMOTIDINE 20 MG PO TABS
ORAL_TABLET | ORAL | Status: AC
  Filled 2017-09-11: qty 1

## 2017-09-11 MED ORDER — LACTATED RINGERS IV SOLN
INTRAVENOUS | Status: DC
  Administered 2017-09-11 – 2017-09-12 (×2): via INTRAVENOUS

## 2017-09-11 MED ORDER — ROCURONIUM BROMIDE 100 MG/10ML IV SOLN
INTRAVENOUS | Status: DC | PRN
  Administered 2017-09-11: 30 mg via INTRAVENOUS
  Administered 2017-09-11: 20 mg via INTRAVENOUS
  Administered 2017-09-11: 50 mg via INTRAVENOUS

## 2017-09-11 MED ORDER — DOXAZOSIN MESYLATE 4 MG PO TABS
4.0000 mg | ORAL_TABLET | Freq: Every day | ORAL | Status: DC
  Administered 2017-09-11 – 2017-09-13 (×3): 4 mg via ORAL
  Filled 2017-09-11 (×4): qty 1

## 2017-09-11 MED ORDER — EPINEPHRINE PF 1 MG/ML IJ SOLN
INTRAMUSCULAR | Status: AC
  Filled 2017-09-11: qty 1

## 2017-09-11 MED ORDER — ROPIVACAINE HCL 5 MG/ML IJ SOLN
INTRAMUSCULAR | Status: AC
  Filled 2017-09-11: qty 30

## 2017-09-11 MED ORDER — FENTANYL CITRATE (PF) 100 MCG/2ML IJ SOLN
INTRAMUSCULAR | Status: AC | PRN
  Administered 2017-09-11 (×2): 25 ug via INTRAVENOUS

## 2017-09-11 MED ORDER — BISACODYL 10 MG RE SUPP: 10.0000 mg | Freq: Every day | RECTAL | Status: DC | PRN

## 2017-09-11 MED ORDER — ACETAMINOPHEN 650 MG RE SUPP
650.0000 mg | RECTAL | Status: DC | PRN
Start: 2017-09-11 — End: 2017-09-14

## 2017-09-11 MED ORDER — CHLORHEXIDINE GLUCONATE 4 % EX LIQD: 60.0000 mL | Freq: Once | CUTANEOUS | Status: DC

## 2017-09-11 MED ORDER — MAGNESIUM OXIDE 400 (241.3 MG) MG PO TABS
200.0000 mg | ORAL_TABLET | Freq: Every day | ORAL | Status: DC
  Administered 2017-09-11 – 2017-09-13 (×3): 200 mg via ORAL
  Filled 2017-09-11 (×3): qty 1

## 2017-09-11 MED ORDER — LIDOCAINE HCL (PF) 1 % IJ SOLN
INTRAMUSCULAR | Status: AC | PRN
  Administered 2017-09-11: 6 mL

## 2017-09-11 MED ORDER — ONDANSETRON HCL 4 MG PO TABS: 4.0000 mg | ORAL_TABLET | Freq: Four times a day (QID) | ORAL | Status: DC | PRN

## 2017-09-11 MED ORDER — ROPIVACAINE HCL 5 MG/ML IJ SOLN
INTRAMUSCULAR | Status: DC | PRN
  Administered 2017-09-11: 30 mL via PERINEURAL

## 2017-09-11 MED ORDER — CEFAZOLIN SODIUM-DEXTROSE 1-4 GM/50ML-% IV SOLN
1.0000 g | Freq: Four times a day (QID) | INTRAVENOUS | Status: DC
  Filled 2017-09-11 (×3): qty 50

## 2017-09-11 MED ORDER — FAMOTIDINE 20 MG PO TABS
20.0000 mg | ORAL_TABLET | Freq: Once | ORAL | Status: AC
  Administered 2017-09-11: 20 mg via ORAL

## 2017-09-11 MED ORDER — MIDAZOLAM HCL 2 MG/2ML IJ SOLN
INTRAMUSCULAR | Status: AC
  Administered 2017-09-11: 1 mg via INTRAVENOUS
  Filled 2017-09-11: qty 2

## 2017-09-11 MED ORDER — SEVOFLURANE IN SOLN
RESPIRATORY_TRACT | Status: AC
  Filled 2017-09-11: qty 250

## 2017-09-11 MED ORDER — MIDAZOLAM HCL 5 MG/5ML IJ SOLN
INTRAMUSCULAR | Status: AC | PRN
  Administered 2017-09-11 (×2): 1 mg via INTRAVENOUS

## 2017-09-11 MED ORDER — SODIUM CHLORIDE 0.9 % IR SOLN
Status: DC | PRN
  Administered 2017-09-11: 1000 mL

## 2017-09-11 MED ORDER — LIDOCAINE HCL (PF) 1 % IJ SOLN
INTRAMUSCULAR | Status: AC
  Filled 2017-09-11: qty 5

## 2017-09-11 MED ORDER — PROPOFOL 10 MG/ML IV BOLUS
INTRAVENOUS | Status: AC
  Filled 2017-09-11: qty 20

## 2017-09-11 MED ORDER — AMLODIPINE BESYLATE 5 MG PO TABS
5.0000 mg | ORAL_TABLET | Freq: Every day | ORAL | Status: DC
  Administered 2017-09-11 – 2017-09-13 (×3): 5 mg via ORAL
  Filled 2017-09-11 (×3): qty 1

## 2017-09-11 MED ORDER — EPHEDRINE SULFATE 50 MG/ML IJ SOLN
INTRAMUSCULAR | Status: DC | PRN
  Administered 2017-09-11 (×2): 10 mg via INTRAVENOUS

## 2017-09-11 MED ORDER — ACETAMINOPHEN 500 MG PO TABS
1000.0000 mg | ORAL_TABLET | Freq: Four times a day (QID) | ORAL | Status: AC
  Administered 2017-09-12: 1000 mg via ORAL
  Filled 2017-09-11: qty 2

## 2017-09-11 MED ORDER — GABAPENTIN 300 MG PO CAPS
300.0000 mg | ORAL_CAPSULE | Freq: Once | ORAL | Status: AC
  Administered 2017-09-11: 300 mg via ORAL

## 2017-09-11 MED ORDER — ONDANSETRON HCL 4 MG/2ML IJ SOLN: 4.0000 mg | Freq: Four times a day (QID) | INTRAMUSCULAR | Status: DC | PRN

## 2017-09-11 MED ORDER — FENTANYL CITRATE (PF) 100 MCG/2ML IJ SOLN
INTRAMUSCULAR | Status: AC
  Administered 2017-09-11: 50 ug via INTRAVENOUS
  Filled 2017-09-11: qty 2

## 2017-09-11 MED ORDER — MORPHINE SULFATE (PF) 4 MG/ML IV SOLN
INTRAVENOUS | Status: AC
  Administered 2017-09-11: 2 mg via INTRAVENOUS
  Filled 2017-09-11: qty 1

## 2017-09-11 SURGICAL SUPPLY — 60 items
BLADE BOVIE TIP EXT 4 (BLADE) ×3 IMPLANT
BLADE SAW 1 (BLADE) ×3 IMPLANT
BNDG COHESIVE 4X5 TAN STRL (GAUZE/BANDAGES/DRESSINGS) ×3 IMPLANT
BOWL CEMENT MIX W/ADAPTER (MISCELLANEOUS) ×3 IMPLANT
BRUSH SCRUB EZ  4% CHG (MISCELLANEOUS) ×4
BRUSH SCRUB EZ 4% CHG (MISCELLANEOUS) ×2 IMPLANT
CANISTER SUCT 1200ML W/VALVE (MISCELLANEOUS) ×3 IMPLANT
CANISTER SUCT 3000ML PPV (MISCELLANEOUS) ×6 IMPLANT
CAPT SHLDR TOTAL 2 ×3 IMPLANT
CEMENT BONE 1-PACK (Cement) ×3 IMPLANT
CHLORAPREP W/TINT 26ML (MISCELLANEOUS) ×6 IMPLANT
CLOSURE WOUND 1/2 X4 (GAUZE/BANDAGES/DRESSINGS)
COVER MAYO STAND STRL (DRAPES) ×3 IMPLANT
CRADLE LAMINECT ARM (MISCELLANEOUS) ×6 IMPLANT
DRAPE IMP U-DRAPE 54X76 (DRAPES) ×6 IMPLANT
DRAPE INCISE IOBAN 66X45 STRL (DRAPES) ×3 IMPLANT
DRAPE INCISE IOBAN 66X60 STRL (DRAPES) ×3 IMPLANT
DRAPE SHEET LG 3/4 BI-LAMINATE (DRAPES) ×6 IMPLANT
DRAPE U-SHAPE 47X51 STRL (DRAPES) ×9 IMPLANT
DRSG TEGADERM 6X8 (GAUZE/BANDAGES/DRESSINGS) ×3 IMPLANT
ELECT REM PT RETURN 9FT ADLT (ELECTROSURGICAL) ×3
ELECTRODE REM PT RTRN 9FT ADLT (ELECTROSURGICAL) ×1 IMPLANT
GAUZE PETRO XEROFOAM 1X8 (MISCELLANEOUS) ×3 IMPLANT
GAUZE SPONGE 4X4 12PLY STRL (GAUZE/BANDAGES/DRESSINGS) IMPLANT
GLOVE INDICATOR 8.0 STRL GRN (GLOVE) ×15 IMPLANT
GLOVE SURG ORTHO 8.0 STRL STRW (GLOVE) ×15 IMPLANT
GOWN STRL REUS W/ TWL LRG LVL3 (GOWN DISPOSABLE) ×2 IMPLANT
GOWN STRL REUS W/ TWL XL LVL3 (GOWN DISPOSABLE) ×1 IMPLANT
GOWN STRL REUS W/TWL LRG LVL3 (GOWN DISPOSABLE) ×4
GOWN STRL REUS W/TWL XL LVL3 (GOWN DISPOSABLE) ×2
IV NS 1000ML (IV SOLUTION) ×2
IV NS 1000ML BAXH (IV SOLUTION) ×1 IMPLANT
KIT STABILIZATION SHOULDER (MISCELLANEOUS) ×3 IMPLANT
KIT TURNOVER KIT A (KITS) ×3 IMPLANT
MASK FACE SPIDER DISP (MASK) ×3 IMPLANT
MAT BLUE FLOOR 46X72 FLO (MISCELLANEOUS) ×3 IMPLANT
NDL MAYO CATGUT SZ5 (NEEDLE) ×2
NDL SAFETY ECLIPSE 18X1.5 (NEEDLE) ×1 IMPLANT
NDL SUT 5 .5 CRC TPR PNT MAYO (NEEDLE) ×1 IMPLANT
NEEDLE HYPO 18GX1.5 SHARP (NEEDLE) ×2
NEEDLE MAYO 6 CRC TAPER PT (NEEDLE) ×3 IMPLANT
NS IRRIG 1000ML POUR BTL (IV SOLUTION) ×3 IMPLANT
PACK ARTHROSCOPY SHOULDER (MISCELLANEOUS) ×3 IMPLANT
PULSAVAC PLUS IRRIG FAN TIP (DISPOSABLE) ×3
SLING ARM LRG DEEP (SOFTGOODS) ×3 IMPLANT
SPONGE LAP 18X18 5 PK (GAUZE/BANDAGES/DRESSINGS) ×3 IMPLANT
STAPLER SKIN PROX 35W (STAPLE) ×3 IMPLANT
STRAP SAFETY 5IN WIDE (MISCELLANEOUS) ×3 IMPLANT
STRIP CLOSURE SKIN 1/2X4 (GAUZE/BANDAGES/DRESSINGS) IMPLANT
SUT BONE WAX W31G (SUTURE) IMPLANT
SUT TICRON 2-0 30IN 311381 (SUTURE) IMPLANT
SUT VIC AB 0 CT2 27 (SUTURE) ×3 IMPLANT
SUT VIC AB 2-0 CT1 18 (SUTURE) ×6 IMPLANT
SUT VIC AB PLUS 45CM 1-MO-4 (SUTURE) ×3 IMPLANT
SYR 10ML LL (SYRINGE) ×3 IMPLANT
SYR TOOMEY 50ML (SYRINGE) ×3 IMPLANT
SYRINGE IRR TOOMEY STRL 70CC (SYRINGE) ×3 IMPLANT
TAPE SUT 30 1/2 CRC GREEN (SUTURE) ×6 IMPLANT
TIP FAN IRRIG PULSAVAC PLUS (DISPOSABLE) ×1 IMPLANT
WATER STERILE IRR 1000ML POUR (IV SOLUTION) ×6 IMPLANT

## 2017-09-11 NOTE — H&P (Signed)
The patient has been re-examined, and the chart reviewed, and there have been no interval changes to the documented history and physical.  Plan a left total shoulder today. ? ?Anesthesia is consulted regarding a peripheral nerve block for post-operative pain. ? ?The risks, benefits, and alternatives have been discussed at length, and the patient is willing to proceed.   ? ?

## 2017-09-11 NOTE — Consult Note (Signed)
[facility] - [place] [facility]   PATIENT NAME: Joel Mcintosh Quirion    MR#:  [id number]  DATE OF BIRTH:  [birth year]  DATE OF ADMISSION:  09/11/2017  PRIMARY CARE PHYSICIAN: Carmin Richmondavis, James W, MD   REQUESTING/REFERRING PHYSICIAN: Dr Yves DillPaul Carroll  CHIEF COMPLAINT:  Consult for chest pain  HISTORY OF PRESENT ILLNESS:  Joel Mcintosh Seabrook  is a 77 y.o. male who underwent a elective procedure today for left total arthroplasty.  When he came through from anesthesia he complained of some chest pain and shortness of breath.  Chest x-ray showed a large left pneumothorax.  EKG was unremarkable.  Interventional radiology was consulted for chest tube placement.  I was consulted for chest pain.  The patient states that he is doing better after some pain medications.  Pain was described as soreness.  He states his breathing is okay now after some pain medications.  He states that his legs cramp at night since he has been off his magnesium.  PAST MEDICAL HISTORY:   Past Medical History:  Diagnosis Date  . Arthritis   . Bradycardia   . Chronic kidney disease    renal insufficiency  . Hyperlipidemia   . Hypertension   . Sleep apnea     PAST SURGICAL HISTOIRY:   Past Surgical History:  Procedure Laterality Date  . COLONOSCOPY    . left shoulder surgery      SOCIAL HISTORY:   Social History   Tobacco Use  . Smoking status: Former Games developermoker  . Smokeless tobacco: Former Engineer, waterUser  Substance Use Topics  . Alcohol use: No    Frequency: Never    FAMILY HISTORY:   Family History  Problem Relation Age of Onset  . Pancreatic cancer Mother   . CAD Father     DRUG ALLERGIES:  No Known Allergies  REVIEW OF SYSTEMS:  CONSTITUTIONAL: No fever, fatigue or weakness.  EYES: No blurred or double vision.  EARS, NOSE, AND THROAT: No tinnitus or ear pain.  Decreased hearing RESPIRATORY: No cough, some shortness of breath, no wheezing or hemoptysis.  CARDIOVASCULAR: Some chest pain, no  orthopnea, edema.  GASTROINTESTINAL: No nausea, vomiting, diarrhea or abdominal pain.  GENITOURINARY: No dysuria, hematuria.  ENDOCRINE: No polyuria, nocturia,  HEMATOLOGY: No anemia, easy bruising or bleeding SKIN: No rash or lesion. MUSCULOSKELETAL: No joint pain or arthritis.  Cramps in the legs.  Shoulder pain NEUROLOGIC: No tingling, numbness, weakness.  PSYCHIATRY: No anxiety or depression.   MEDICATIONS AT HOME:   Prior to Admission medications   Medication Sig Start Date End Date Taking? Authorizing Provider  amLODipine (NORVASC) 5 MG tablet Take 5 mg by mouth at bedtime.    Yes [provider]  aspirin EC 81 MG tablet Take 81 mg by mouth daily.   Yes [provider]  doxazosin (CARDURA) 4 MG tablet Take 4 mg by mouth at bedtime.    Yes [provider]  GARLIC PO Take 4,0981,000 mg by mouth daily.    Yes [provider]  ibuprofen (ADVIL,MOTRIN) 200 MG tablet Take 200 mg by mouth at bedtime.    Yes [provider]  losartan (COZAAR) 100 MG tablet Take 100 mg by mouth daily. AM   Yes [provider]  Melatonin 5 MG TABS Take 5 mg by mouth at bedtime.   Yes [provider]  meloxicam (MOBIC) 7.5 MG tablet Take 7.5 mg by mouth daily.   Yes [provider]  Omega-3 Fatty Acids (FISH OIL)  1000 MG CAPS Take 1,000 mg by mouth 2 (two) times daily.   Yes [provider]  potassium chloride SA (K-DUR,KLOR-CON) 20 MEQ tablet Take 20 mEq by mouth daily.   Yes [provider]  traMADol (ULTRAM) 50 MG tablet Take 50 mg by mouth daily as needed for moderate pain. TAKES 2 TAB AM   Yes [provider]  indomethacin (INDOCIN) 25 MG capsule Take 25 mg by mouth daily as needed (gout flare).    [provider]  naproxen sodium (ALEVE) 220 MG tablet Take 220 mg by mouth daily as needed (pain).    [provider]  oxyCODONE-acetaminophen (PERCOCET/ROXICET) 5-325 MG tablet Take 1 tablet by mouth  daily as needed for pain. 06/27/17   [provider]      VITAL SIGNS:  Blood pressure 138/72, pulse 89, temperature 98.2 F (36.8 C), resp. rate 14, height 5\' 10"  (1.778 m), weight 76.7 kg (169 lb), SpO2 98 %.  PHYSICAL EXAMINATION:  GENERAL:  77 y.o.-year-old patient lying in the bed with no acute distress.  EYES: Pupils equal, round, reactive to light and accommodation. No scleral icterus. Extraocular muscles intact.  HEENT: Head atraumatic, normocephalic. Oropharynx and nasopharynx clear.  NECK:  Supple, no jugular venous distention. No thyroid enlargement, no tenderness.  LUNGS: Decreased breath sounds left lung.  Good breath sounds right lung, no wheezing, rales,rhonchi or crepitation. No use of accessory muscles of respiration.  CARDIOVASCULAR: S1, S2 normal. No murmurs, rubs, or gallops.  ABDOMEN: Soft, nontender, nondistended. Bowel sounds present. No organomegaly or mass.  EXTREMITIES: No pedal edema, cyanosis, or clubbing.  NEUROLOGIC: Cranial nerves II through XII are intact. Muscle strength 5/5 in all extremities. Sensation intact. Gait not checked.  PSYCHIATRIC: The patient is alert and oriented x 3.  SKIN: No obvious rash, lesion, or ulcer.   LABORATORY PANEL:   Ordered by me RADIOLOGY:  Dg Chest Port 1 View  Result Date: 09/11/2017 CLINICAL DATA:  77 y/o M; status post shoulder replacement, evaluate for pneumothorax. EXAM: PORTABLE CHEST 1 VIEW COMPARISON:  None. FINDINGS: Normal size cardiac silhouette. Aortic atherosclerosis with calcification. Large left pneumothorax and partial collapse of the left lung. Elevated left hemidiaphragm. Clear right lung. Postsurgical changes related to left proximal humerus replacement partially visualized with air in soft tissues and skin staples. IMPRESSION: Large left pneumothorax, partial collapse of left lung, elevated left hemidiaphragm. Critical Value/emergent results were called by telephone at the time of interpretation on  09/11/2017 at 2:13 pm to Dr. Yves Dill , who verbally acknowledged these results. Electronically Signed   By: Mitzi Hansen M.D.   On: 09/11/2017 14:22   Dg Shoulder Left Port  Result Date: 09/11/2017 CLINICAL DATA:  Postop day 0 left total shoulder joint replacement. EXAM: LEFT SHOULDER - 1 VIEW COMPARISON:  None in PACs FINDINGS: There is an abnormal appearance of the left lung with a pleural line and some consolidated lung centrally worrisome for an approximately 60% pneumothorax. The patient has undergone left total shoulder joint prosthesis placement. Radiographic positioning of the prosthetic components is good. The interface with the native bone appears normal. No acute native bone fracture is observed. There are small amounts of subcutaneous gas present. IMPRESSION: Proximally 60% left-sided pneumothorax. No immediate complication involving the left shoulder joint replacement per se. The report was called by me to Eve, R.N. in PACU at 1:53 p.m. on September 11, 2017. Electronically Signed   By: David  [place] M.D.   On: 09/11/2017 13:56  EKG:   Normal sinus rhythm 77 bpm no acute ST-T wave changes.  IMPRESSION AND PLAN:   1.  Postoperative pneumothorax.  I believe this is the cause of the patient's chest pain and shortness of breath.  Interventional radiology to place a chest tube today.  Patient will probably feel a lot better after this is placed. 2.  Essential hypertension continue losartan and Norvasc 3.  Restless leg syndrome trial of Requip 0.25 mg nightly 4.  History of sleep apnea 5.  BPH on Cardura 6.  Left shoulder surgery today.  Pain control as per surgery  All the records are reviewed and case discussed with Consulting provider. Management plans discussed with the patient, family and they are in agreement.  CODE STATUS: Full code  TOTAL TIME TAKING CARE OF THIS PATIENT: 50 minutes.    Alford Highland M.D on 09/11/2017 at 3:48 PM  Between 7am to 6pm - Pager -  [phone]  After 6pm go to [email or web address] - password [email or web address]  [facility]  Office  [phone]  CC: Primary care Physician: Carmin Richmond, MD

## 2017-09-11 NOTE — Op Note (Signed)
09/11/2017  1:13 PM  Patient:   Joel Mcintosh  Pre-Op Diagnosis:   Degenerative joint disease, left shoulder.  Post-Op Diagnosis:   Same.  Procedure:   left total shoulder arthroplasty.  Surgeon:   Cassell Smiles, MD  Assistant:  Hurshel Keys, PA-C  Anesthesia:   General endotracheal intubation with an interscalene block.  Findings:   As above. The rotator cuff was in satisfactory condition.  Complications:   None  EBL:  100 cc  Drains:   None  Closure:   Staples  Implants:   [employer] system with a standard stem size 10  With a 135 degree porocoat proximal body size 10, a 48 x 18 mm humeral head, and a cemented glenoid component size 48.  Brief Clinical Note:   The patient is a 77 year old man with chronic and severe left shoulder pain. The pain interferes with his daily activities including, lifting, getting up from a chair, and sleeping. He has failed conservative measures including activity modification, NSAIDs, and injections. The patient presents at this time for a left total shoulder arthroplasty.  Procedure:   The patient was brought into the operating room and lain in the supine position on the OR table. After adequate IV sedation was achieved, an interscalene block was placed by the anesthesiologist. The patient then underwent general endotracheal intubation and anesthesia before being repositioned in the beach chair position using the beach chair positioner. A Foley catheter was placed by the nurse. The left shoulder and upper extremity were prepped with ChloraPrep solution before being draped sterilely. Preoperative antibiotics were administered. A standard anterior approach to the shoulder was made through an approximately 4-5 inch incision. The incision was carried down through the subcutaneous tissues to expose the deltopectoral fascia. The interval between the deltoid and pectoralis muscles was identified and this plane developed, retracting the cephalic vein  laterally with the deltoid muscle. The conjoined tendon was identified. The lateral margin was dissected and the Kolbel self-retraining retractor inserted. The "three sisters" were identified and cauterized. Bursal tissues were removed to improve visualization. The subscapularis tendon was released from its attachment to the lesser tuberosity 1 cm proximal to its insertion and several tagging sutures placed. The inferior capsule was released with care after identifying and protecting the axillary nerve. The proximal humeral cut was made at approximately 30 of retroversion using the extra-medullary guide.   Attention was directed to the glenoid. The labrum was debrided circumferentially before the center of the glenoid was marked with electrocautery. The small and medium sizers were positioned and it was elected to proceed with a small glenoid component. The guidewire was drilled into the glenoid neck using the appropriate guide. After verifying its position, the glenoid was lightly reamed with the butterfly reamer before the centralizing reamer was used. The small peripheral peg guide was positioned and each of the three pegs drilled sequentially, leaving a peg in place so as to minimize shifting of the guide. The bony surfaces were prepared for cementing by irrigating them thoroughly with bacitracin saline solution using the jet lavage system. Meanwhile, cement was mixed on the back table. When it was ready, some cement was injected into each of the three peg holes using a Toomey syringe and additional cement applied to the posterior aspect of the glenoid component. The component was impacted into place and the excess cement was removed. Pressure was maintained on the glenoid until the cement hardened.  Attention was directed to the humeral side. The  humeral canal was reamed sequentially beginning with the end-cutting reamer then progressing up to a 48 mm reamer. This provided excellent circumferential  chatter. The canal was prepared using a brosteotome. A trial reduction performed. The arm demonstrated excellent range of motion as the hand could be brought across the chest to the opposite shoulder and brought to the top of the patient's head and to the patient's ear. The shoulder remained stable throughout this range of motion, and was stable with abduction and external rotation. The joint was dislocated and the trial components removed. The final components were impacted into place with care taken to maintain the appropriate version. Again, the [facility] taper locking mechanism was verified using manual distraction. The shoulder was relocated and again placed through a range of motion with the findings as described above.  The wound was copiously irrigated with bacitracin saline solution using the jet lavage system. The subscapularis tendon was reapproximated using 2mm cottony Dacron sutures. The deltopectoral interval was closed using #0 Vicryl interrupted sutures before the subcutaneous tissues were closed using 2-0 Vicryl interrupted sutures. The skin was closed using staples. A sterile occlusive dressing was applied to the wound before the arm was placed into a sling. The patient was then transferred back to a hospital bed before being awakened, extubated, and returned to the recovery room in satisfactory condition after tolerating the procedure well.

## 2017-09-11 NOTE — Anesthesia Procedure Notes (Signed)
Procedure Name: Intubation Date/Time: 09/11/2017 10:00 AM Performed by: Lesle Reek, CRNA Pre-anesthesia Checklist: Patient identified, Emergency Drugs available, Suction available, Patient being monitored and Timeout performed Patient Re-evaluated:Patient Re-evaluated prior to induction Oxygen Delivery Method: Circle system utilized Preoxygenation: Pre-oxygenation with 100% oxygen Induction Type: IV induction Ventilation: Mask ventilation without difficulty Laryngoscope Size: Mac and 4 Grade View: Grade II Tube type: Oral Tube size: 7.5 mm Number of attempts: 1 Airway Equipment and Method: Stylet Placement Confirmation: ETT inserted through vocal cords under direct vision,  positive ETCO2,  CO2 detector and breath sounds checked- equal and bilateral Secured at: 22 cm Tube secured with: Tape

## 2017-09-11 NOTE — Transfer of Care (Signed)
Immediate Anesthesia Transfer of Care Note  Patient: Markham Jordanharles R Greenawalt  Procedure(s) Performed: TOTAL SHOULDER ARTHROPLASTY (Left )  Patient Location: PACU  Anesthesia Type:General  Level of Consciousness: awake  Airway & Oxygen Therapy: Patient Spontanous Breathing and Patient connected to face mask oxygen  Post-op Assessment: Report given to RN  Post vital signs: Reviewed and stable  Last Vitals:  Vitals:   09/11/17 1258 09/11/17 1302  BP: 128/70 (!) 145/75  Pulse: 84 82  Resp: 16 13  Temp: 36.5 C 37 C  SpO2: 98% 99%    Last Pain:  Vitals:   09/11/17 1258  TempSrc:   PainSc: Asleep         Complications: No apparent anesthesia complications

## 2017-09-11 NOTE — Consult Note (Addendum)
SURGICAL CONSULTATION NOTE (initial) - cpt: 99254  HISTORY OF PRESENT ILLNESS (HPI):  77 y.o. male presented to [facility] today for elective Left total shoulder arthoplasty under general anesthesia with interscalene nerve block. Post-operative chest x-ray demonstrated 60 - 70% pneumothorax, for which surgery and IR were both consulted. Patient reports mild Left chest pain, though denies any SOB or difficulty with deep inspiration (as he readily volunteers to demonstrate). Patient says he last smoked "occasionally" >40 years ago and denies any prior pneumothoraces or having been diagnosed with known COPD. Patient otherwise denies fever/chills, N/V, and says he can normally ambulate without CP or SOB.  Surgery is consulted by orthopedic surgeon Dr. Odis Luster and anesthesiologist Dr. Noralyn Pick in this context for evaluation and management of Left pneumothorax.  PAST MEDICAL HISTORY (PMH):  Past Medical History:  Diagnosis Date  . Arthritis   . Bradycardia   . Chronic kidney disease    renal insufficiency  . Hyperlipidemia   . Hypertension   . Sleep apnea      PAST SURGICAL HISTORY [facility]):  Past Surgical History:  Procedure Laterality Date  . COLONOSCOPY       MEDICATIONS:  Prior to Admission medications   Medication Sig Start Date End Date Taking? Authorizing Provider  amLODipine (NORVASC) 5 MG tablet Take 5 mg by mouth at bedtime.    Yes [provider]  aspirin EC 81 MG tablet Take 81 mg by mouth daily.   Yes [provider]  doxazosin (CARDURA) 4 MG tablet Take 4 mg by mouth at bedtime.    Yes [provider]  GARLIC PO Take 5,621 mg by mouth daily.    Yes [provider]  ibuprofen (ADVIL,MOTRIN) 200 MG tablet Take 200 mg by mouth at bedtime.    Yes [provider]  losartan (COZAAR) 100 MG tablet Take 100 mg by mouth daily. AM   Yes [provider]  Melatonin 5 MG TABS Take 5 mg by mouth at bedtime.   Yes [provider]   meloxicam (MOBIC) 7.5 MG tablet Take 7.5 mg by mouth daily.   Yes [provider]  Omega-3 Fatty Acids (FISH OIL) 1000 MG CAPS Take 1,000 mg by mouth 2 (two) times daily.   Yes [provider]  potassium chloride SA (K-DUR,KLOR-CON) 20 MEQ tablet Take 20 mEq by mouth daily.   Yes [provider]  traMADol (ULTRAM) 50 MG tablet Take 50 mg by mouth daily as needed for moderate pain. TAKES 2 TAB AM   Yes [provider]  indomethacin (INDOCIN) 25 MG capsule Take 25 mg by mouth daily as needed (gout flare).    [provider]  naproxen sodium (ALEVE) 220 MG tablet Take 220 mg by mouth daily as needed (pain).    [provider]  oxyCODONE-acetaminophen (PERCOCET/ROXICET) 5-325 MG tablet Take 1 tablet by mouth daily as needed for pain. 06/27/17   [provider]     ALLERGIES:  No Known Allergies   SOCIAL HISTORY:  Social History   Socioeconomic History  . Marital status: Married    Spouse name: Not on file  . Number of children: Not on file  . Years of education: Not on file  . Highest education level: Not on file  Social Needs  . Financial resource strain: Not on file  . Food insecurity - worry: Not on file  . Food insecurity - inability: Not on file  . Transportation needs - medical: Not on file  . Transportation  needs - non-medical: Not on file  Occupational History  . Not on file  Tobacco Use  . Smoking status: Former Games developer  . Smokeless tobacco: Former Engineer, water and Sexual Activity  . Alcohol use: No    Frequency: Never  . Drug use: No  . Sexual activity: Not on file  Other Topics Concern  . Not on file  Social History Narrative  . Not on file    The patient currently resides (home / rehab facility / nursing home): Home The patient normally is (ambulatory / bedbound): Ambulatory   FAMILY HISTORY:  History reviewed. No pertinent family history.   REVIEW OF SYSTEMS:  Constitutional: denies weight  loss, fever, chills, or sweats  Eyes: denies any other vision changes, history of eye injury  ENT: denies sore throat, hearing problems  Respiratory: denies shortness of breath, wheezing Cardiovascular: mild Left CP as per HPI, denies palpitations  Gastrointestinal: denies abdominal pain, N/V, or diarrhea Genitourinary: denies burning with urination or urinary frequency Musculoskeletal: denies any other joint pains or cramps except peri-op mild Left shoulder pain Skin: denies any other rashes or skin discolorations  Neurological: denies any other headache, dizziness, weakness  Psychiatric: denies any other depression, anxiety   All other review of systems were negative   VITAL SIGNS:  Temp:  [97.7 F (36.5 C)-98.6 F (37 C)] 98.6 F (37 C) (02/04 1302) Pulse Rate:  [56-84] 81 (02/04 1428) Resp:  [6-17] 12 (02/04 1428) BP: (113-163)/(68-85) 125/72 (02/04 1428) SpO2:  [93 %-100 %] 97 % (02/04 1428) Weight:  [169 lb (76.7 kg)] 169 lb (76.7 kg) (02/04 0826)     Height: 5\' 10"  (177.8 cm) Weight: 169 lb (76.7 kg) BMI (Calculated): 24.25   INTAKE/OUTPUT:  This shift: Total I/O In: 1400 [I.V.:1400] Out: 100 [Blood:100]  Last 2 shifts: @IOLAST2SHIFTS @   PHYSICAL EXAM:  Constitutional:  -- Normal body habitus  -- Awake, alert, and oriented x3, no apparent distress Eyes:  -- Pupils equally round and reactive to light  -- No scleral icterus, B/L no occular discharge Ear, nose, throat: -- Neck is FROM WNL -- No jugular venous distension  Pulmonary:  -- No wheezes or rhales -- Decreased Left chest breath sounds -- Breathing non-labored at rest Cardiovascular:  -- S1, S2 present  -- No pericardial rubs  Gastrointestinal:  -- Abdomen soft, nontender, non-distended, no guarding or rebound tenderness -- No abdominal masses appreciated, pulsatile or otherwise  Musculoskeletal and Integumentary:  -- Wounds or skin discoloration: Left shoulder post-operative dressing c/d/i with mild  peri-incisional TTP -- Extremities: B/L UE and LE FROM, hands and feet warm, no edema  Neurologic:  -- Motor function: Intact and symmetric -- Sensation: Intact and symmetric Psychiatric:  -- Mood and affect WNL  Labs:  CBC Latest Ref Rng & Units 08/30/2017  WBC 3.8 - 10.6 K/uL 5.8  Hemoglobin 13.0 - 18.0 g/dL 16.1  Hematocrit 09.6 - 52.0 % 40.7  Platelets 150 - 440 K/uL 177   CMP Latest Ref Rng & Units 08/30/2017  Glucose 65 - 99 mg/dL 045(W)  BUN 6 - 20 mg/dL 15  Creatinine 0.98 - 1.19 mg/dL 1.47  Sodium 829 - 562 mmol/L 138  Potassium 3.5 - 5.1 mmol/L 3.9  Chloride 101 - 111 mmol/L 104  CO2 22 - 32 mmol/L 26  Calcium 8.9 - 10.3 mg/dL 9.4   Imaging studies:  Chest X-ray (09/11/2017) - personally reviewed and discussed with patient, his family, IR, Dr. Odis Luster, and Dr. Noralyn Pick Large left  pneumothorax and partial collapse of the left lung. Elevated left hemidiaphragm. Clear right lung. Postsurgical changes related to left proximal humerus replacement partially visualized with air in soft tissues and skin staples.  Assessment/Plan: (ICD-10's: J93.83) 77 y.o. male with 5760 - 70% Left pneumothorax without significant visible pleural fluid component s/p Left total shoulder arthoplasty under general anesthesia with intrascalene nerve block, complicated by pertinent comorbidities including HTN, HLD, OSA, bradycardia, CKD, and osteoarthritis.   - discussed with Dr. Fredia SorrowYamagata, will proceed with image-guided placement of Left pleural pigtail catheter   - Left pigtail catheter to continuous Pleuravac 20 mmH2O suction with follow-up am CXR unless symptomatic sooner  - pain control prn and medical management as per primary team and medical team, respectively  - DVT prophylaxis, ambulation encouraged when able  - will discuss with Dr. Thelma Bargeaks, thoracic surgery  All of the above findings and recommendations were discussed with the patient, his family, Dr. Odis LusterBowers, and Dr. Noralyn Pickarroll, and all of  patient's and his family's questions were answered to their expressed satisfaction.  Thank you for the opportunity to participate in this patient's care.   -- Scherrie GerlachJason E. Earlene Plateravis, MD, RPVI [place]: [facility] - Partnering for exceptional care. Office: [phone]

## 2017-09-11 NOTE — Anesthesia Preprocedure Evaluation (Addendum)
Anesthesia Evaluation  Patient identified by MRN, date of birth, ID band Patient awake    Reviewed: Allergy & Precautions, NPO status , Patient's Chart, lab work & pertinent test results  Airway Mallampati: II  TM Distance: >3 FB     Dental   Pulmonary sleep apnea , former smoker,    Pulmonary exam normal        Cardiovascular hypertension, Pt. on medications Normal cardiovascular exam     Neuro/Psych    GI/Hepatic   Endo/Other    Renal/GU Renal InsufficiencyRenal disease     Musculoskeletal   Abdominal Normal abdominal exam  (+)   Peds  Hematology   Anesthesia Other Findings   Reproductive/Obstetrics                            Anesthesia Physical Anesthesia Plan  ASA: III  Anesthesia Plan: General   Post-op Pain Management:    Induction: Intravenous  PONV Risk Score and Plan:   Airway Management Planned: Oral ETT  Additional Equipment:   Intra-op Plan:   Post-operative Plan: Extubation in OR  Informed Consent: I have reviewed the patients History and Physical, chart, labs and discussed the procedure including the risks, benefits and alternatives for the proposed anesthesia with the patient or authorized representative who has indicated his/her understanding and acceptance.   Dental advisory given  Plan Discussed with: CRNA and Surgeon  Anesthesia Plan Comments: (Dr. Henrene HawkingKephart discussed the case with the patient, and he evaluated the patient for block.)        Anesthesia Quick Evaluation

## 2017-09-11 NOTE — Progress Notes (Signed)
Per Dr. Juliene PinaMody prime doc okay to place order for home CPAP.

## 2017-09-11 NOTE — Anesthesia Post-op Follow-up Note (Signed)
Anesthesia QCDR form completed.        

## 2017-09-11 NOTE — Anesthesia Procedure Notes (Signed)
Anesthesia Regional Block: Interscalene brachial plexus block   Pre-Anesthetic Checklist: ,, timeout performed, Correct Patient, Correct Site, Correct Laterality, Correct Procedure, Correct Position, site marked, Risks and benefits discussed,  Surgical consent,  Pre-op evaluation,  At surgeon's request and post-op pain management  Laterality: Left  Prep: chloraprep       Needles:  Injection technique: Single-shot  Needle Type: Echogenic Stimulator Needle     Needle Length: 9cm  Needle Gauge: 21     Additional Needles:   Procedures:, nerve stimulator,,, ultrasound used (permanent image in chart),,,,   Nerve Stimulator or Paresthesia:  Response: biceps flexion, 0.8 mA,   Additional Responses:   Narrative:  Start time: 09/11/2017 9:36 AM End time: 09/11/2017 9:44 AM Injection made incrementally with aspirations every 5 mL.  Performed by: Personally   Additional Notes: Functioning IV was confirmed and monitors were applied.  A 50mm 22ga Stimuplex needle was used. Sterile prep and drape,hand hygiene and sterile gloves were used.  Negative aspiration and negative test dose prior to incremental administration of local anesthetic. The patient tolerated the procedure well.

## 2017-09-11 NOTE — Progress Notes (Signed)
Patient ID: Joel Jordanharles R Gurganus, male   DOB: 1941-03-19, [age] y.o.   MRN: [id number]   Interventional Radiology:  Called by Dr. Noralyn Pickarroll and also spoke with Dr. Earlene Plateravis regarding large left pneumothorax post-operatively.  CXR reviewed:  Approximately 60-70% left PTX without visible component of significant pleural fluid.  Patient currently stable.  Will proceed with CT guided pigtail thoracostomy tube placement.  This will be connected to a Pleur-evac system and wall suction after placement.  Spoke with patient and his family.  Questions answered. Consent for chest tube placement obtained from patient's son.  Consent signed and in chart.  Jodi MarbleGlenn T. Fredia SorrowYamagata, M.D Pager:  [phone]

## 2017-09-11 NOTE — Anesthesia Postprocedure Evaluation (Signed)
Anesthesia Post Note  Patient: Joel JordanCharles R Grosch  Procedure(s) Performed: TOTAL SHOULDER ARTHROPLASTY (Left )  Patient location during evaluation: PACU Anesthesia Type: General Level of consciousness: awake and alert and oriented Pain management: pain level controlled Vital Signs Assessment: post-procedure vital signs reviewed and stable Respiratory status: spontaneous breathing Cardiovascular status: blood pressure returned to baseline Anesthetic complications: no Comments: Patient doing well in PACU with pain.  Called by radiology for reported 60% pneumothorax on film taken for total shoulder.  Confirmed by chest Xray.  Dr. Earlene Plateravis was consulted and IR and it was decided to take the patient down to IR for a pigtail chest tube to be hooked up to intermittent suction.  Patient and family were informed and consent was obtained.  The pneumo appeared to have originated from the inferior aspect and may represent a sponyaneous bleb.  Dr. Earlene Plateravis will follow.     Last Vitals:  Vitals:   09/11/17 1443 09/11/17 1458  BP: 137/75   Pulse: 83   Resp: 12   Temp:  (P) 36.8 C  SpO2: 98%     Last Pain:  Vitals:   09/11/17 1358  TempSrc:   PainSc: 5                  Neilson Oehlert

## 2017-09-11 NOTE — Procedures (Signed)
Interventional Radiology Procedure Note  Procedure: CT guided left chest tube placement  Complications: None  Estimated Blood Loss: None  Findings: 12 Fr pigtail chest tube placed in left pleural space from anterior approach.  Connected to [facility] pleur-evac device. Will connect to wall suction at -20 cm water in room.  Jodi MarbleGlenn T. Fredia SorrowYamagata, M.D Pager:  [phone]

## 2017-09-12 ENCOUNTER — Inpatient Hospital Stay: Payer: Medicare Other

## 2017-09-12 ENCOUNTER — Encounter: Payer: Self-pay | Admitting: Orthopedic Surgery

## 2017-09-12 ENCOUNTER — Other Ambulatory Visit: Payer: Self-pay

## 2017-09-12 DIAGNOSIS — J939 Pneumothorax, unspecified: Secondary | ICD-10-CM

## 2017-09-12 LAB — BASIC METABOLIC PANEL
Anion gap: 7 (ref 5–15)
BUN: 15 mg/dL (ref 6–20)
CO2: 25 mmol/L (ref 22–32)
CREATININE: 0.75 mg/dL (ref 0.61–1.24)
Calcium: 8.8 mg/dL — ABNORMAL LOW (ref 8.9–10.3)
Chloride: 106 mmol/L (ref 101–111)
GFR calc non Af Amer: >60 mL/min (ref >60)
GLUCOSE: 115 mg/dL — AB (ref 65–99)
Potassium: 3.8 mmol/L (ref 3.5–5.1)
Sodium: 138 mmol/L (ref 135–145)

## 2017-09-12 LAB — HEMOGLOBIN AND HEMATOCRIT, BLOOD
HEMATOCRIT: 37.3 % — AB (ref 40.0–52.0)
HEMOGLOBIN: 13 g/dL (ref 13.0–18.0)

## 2017-09-12 MED ORDER — MELATONIN 5 MG PO TABS
5.0000 mg | ORAL_TABLET | Freq: Every evening | ORAL | Status: DC | PRN
  Administered 2017-09-12 – 2017-09-13 (×3): 5 mg via ORAL
  Filled 2017-09-12 (×4): qty 1

## 2017-09-12 NOTE — Progress Notes (Signed)
Referring Physician(s): Dr. Earlene Plater  Supervising Physician: Oley Balm  Patient Status:  [facility] - In-pt  Chief Complaint:  Pneumothorax  Subjective: Resting comfortably in bed.  Chest in place, to suction.  Some soreness related to drain but well-managed.   Allergies: Patient has no known allergies.  Medications: Prior to Admission medications   Medication Sig Start Date End Date Taking? Authorizing Provider  amLODipine (NORVASC) 5 MG tablet Take 5 mg by mouth at bedtime.    Yes [provider]  aspirin EC 81 MG tablet Take 81 mg by mouth daily.   Yes [provider]  doxazosin (CARDURA) 4 MG tablet Take 4 mg by mouth at bedtime.    Yes [provider]  GARLIC PO Take 1,610 mg by mouth daily.    Yes [provider]  ibuprofen (ADVIL,MOTRIN) 200 MG tablet Take 200 mg by mouth at bedtime.    Yes [provider]  losartan (COZAAR) 100 MG tablet Take 100 mg by mouth daily. AM   Yes [provider]  Melatonin 5 MG TABS Take 5 mg by mouth at bedtime.   Yes [provider]  meloxicam (MOBIC) 7.5 MG tablet Take 7.5 mg by mouth daily.   Yes [provider]  Omega-3 Fatty Acids (FISH OIL) 1000 MG CAPS Take 1,000 mg by mouth 2 (two) times daily.   Yes [provider]  potassium chloride SA (K-DUR,KLOR-CON) 20 MEQ tablet Take 20 mEq by mouth daily.   Yes [provider]  traMADol (ULTRAM) 50 MG tablet Take 50 mg by mouth daily as needed for moderate pain. TAKES 2 TAB AM   Yes [provider]  indomethacin (INDOCIN) 25 MG capsule Take 25 mg by mouth daily as needed (gout flare).    [provider]  naproxen sodium (ALEVE) 220 MG tablet Take 220 mg by mouth daily as needed (pain).    [provider]  oxyCODONE-acetaminophen (PERCOCET/ROXICET) 5-325 MG tablet Take 1 tablet by mouth daily as needed for pain. 06/27/17   [provider]     Vital Signs: BP 136/72  (BP Location: Right Arm)   Pulse 79   Temp 98 F (36.7 C) (Oral)   Resp 20   Ht 5\' 10"  (1.778 m)   Wt 169 lb (76.7 kg)   SpO2 97%   BMI 24.25 kg/m   Physical Exam  NAD, alert Chest:  Left-sided chest tube in place.  Dressing c/d/i.  30-40 mL of serosanguinous fluid in pleurvac container. Still to wall suction. No air leak.   Imaging: Dg Chest 2 View  Result Date: 09/12/2017 CLINICAL DATA:  Follow-up of left pneumothorax. Shoulder surgery yesterday. Chest tube. EXAM: CHEST  2 VIEW COMPARISON:  Yesterday FINDINGS: Both lateral views are suboptimal secondary to patient arm position. Midline trachea. Mild cardiomegaly. Atherosclerosis in the transverse aorta. Small bilateral pleural effusions. Placement of a small bore left-sided chest tube. Significant improvement in left-sided pneumothorax. Residual approximately 10% apical component. Mild bibasilar atelectasis. IMPRESSION: Placement of left-sided small bore chest tube/drainage catheter. Significant improvement in approximately 10% left apical pneumothorax. Small bilateral pleural effusions with mild bibasilar atelectasis. Aortic Atherosclerosis (ICD10-I70.0). Electronically Signed   By: Jeronimo Greaves M.D.   On: 09/12/2017 07:39   Dg Chest Port 1 View  Result Date: 09/11/2017 CLINICAL DATA:  77 y/o M; status post shoulder replacement, evaluate for pneumothorax. EXAM: PORTABLE CHEST 1 VIEW COMPARISON:  None. FINDINGS: Normal size cardiac silhouette. Aortic atherosclerosis with calcification. Large left  pneumothorax and partial collapse of the left lung. Elevated left hemidiaphragm. Clear right lung. Postsurgical changes related to left proximal humerus replacement partially visualized with air in soft tissues and skin staples. IMPRESSION: Large left pneumothorax, partial collapse of left lung, elevated left hemidiaphragm. Critical Value/emergent results were called by telephone at the time of interpretation on 09/11/2017 at 2:13 pm to Dr. Yves Dill  , who verbally acknowledged these results. Electronically Signed   By: Mitzi Hansen M.D.   On: 09/11/2017 14:22   Dg Shoulder Left Port  Result Date: 09/11/2017 CLINICAL DATA:  Postop day 0 left total shoulder joint replacement. EXAM: LEFT SHOULDER - 1 VIEW COMPARISON:  None in PACs FINDINGS: There is an abnormal appearance of the left lung with a pleural line and some consolidated lung centrally worrisome for an approximately 60% pneumothorax. The patient has undergone left total shoulder joint prosthesis placement. Radiographic positioning of the prosthetic components is good. The interface with the native bone appears normal. No acute native bone fracture is observed. There are small amounts of subcutaneous gas present. IMPRESSION: Proximally 60% left-sided pneumothorax. No immediate complication involving the left shoulder joint replacement per se. The report was called by me to Eve, R.N. in PACU at 1:53 p.m. on September 11, 2017. Electronically Signed   By: David  [place] M.D.   On: 09/11/2017 13:56   Ct Image Guided Drainage By Percutaneous Catheter  Result Date: 09/12/2017 CLINICAL DATA:  Left pneumothorax following left shoulder arthroplasty. EXAM: CT GUIDED LEFT CHEST THORACOSTOMY TUBE PLACEMENT ANESTHESIA/SEDATION: 2.0 mg IV Versed 50 mcg IV Fentanyl Total Moderate Sedation Time:  21 minutes The patient's level of consciousness and physiologic status were continuously monitored during the procedure by Radiology nursing. PROCEDURE: The procedure, risks, benefits, and alternatives were explained to the patient. Questions regarding the procedure were encouraged and answered. The patient understands and consents to the procedure. A time out was performed prior to initiating the procedure. CT of the chest was performed in a supine position. The left anterior chest wall was prepped with chlorhexidine in a sterile fashion, and a sterile drape was applied covering the operative field. A sterile  gown and sterile gloves were used for the procedure. Local anesthesia was provided with 1% Lidocaine. Under CT guidance, an 18 gauge trocar needle was advanced into the anterior left pleural space. After confirming needle tip position, a guidewire was advanced. The tract was dilated and a 12 French pigtail drainage catheter advanced over the wire. Catheter position was confirmed by CT. The catheter was connected to a Pleur-evac device. The catheter was then secured at the skin with a Prolene retention suture, StatLock device and Vaseline gauze dressing. COMPLICATIONS: None FINDINGS: Initial CT demonstrates a large left pneumothorax with near complete collapse of the left lung. The thoracostomy tube was advanced into the anterior pleural space. IMPRESSION: Placement of 12 French left-sided pigtail thoracostomy tube to treat a large left pneumothorax. The tube was attached to a Pleur-evac device which will be attached to wall suction at -20 cm of water. Electronically Signed   By: Irish Lack M.D.   On: 09/12/2017 09:30    Labs:  CBC: Recent Labs    08/30/17 0827 09/12/17 0428  WBC 5.8  --   HGB 14.1 13.0  HCT 40.7 37.3*  PLT 177  --     COAGS: Recent Labs    08/30/17 0827  INR 0.92    BMP: Recent Labs    08/30/17 0827 09/12/17 0428  NA 138  138  K 3.9 3.8  CL 104 106  CO2 26 25  GLUCOSE 119* 115*  BUN 15 15  CALCIUM 9.4 8.8*  CREATININE 0.77 0.75  GFRNONAA >60 >60  GFRAA >60 >60    LIVER FUNCTION TESTS: No results for input(s): BILITOT, AST, ALT, ALKPHOS, PROT, ALBUMIN in the last 8760 hours.  Assessment and Plan: Pneumothorax s/p chest tube placement by Dr. Fredia SorrowYamagata 2/4 Chest tube remains in place today.  CXR shows improvement with residual pneumothorax of an estimated 10%.  Patient in no distress. Discussed with Dr. Deanne CofferHassell who recommends water seal with repeat CXR in 4 hrs.  RN aware.  CXR ordered.  IR to follow.   Electronically Signed: Hoyt KochKacie Sue-Ellen  Aldahir Litaker, PA 09/12/2017, 10:08 AM   I spent a total of 15 Minutes at the the patient's bedside AND on the patient's hospital floor or unit, greater than 50% of which was counseling/coordinating care for pneumothorax.

## 2017-09-12 NOTE — Progress Notes (Signed)
CPAP placed on patient earlier in shift. Pt later C/o chest pains getting worse. Pt took off cpap, and took pain meds, and has had relief from chest pains and would prefer not wearing his cpap for the night. Respiratory status assessed, no SOB, O2 sats WDL. Will continue to monitor.

## 2017-09-12 NOTE — Progress Notes (Signed)
Subjective:  Patient reports pain as mild.  He did not tolerate the CPAP overnite.  Objective:   VITALS:   Vitals:   09/11/17 2100 09/12/17 0052 09/12/17 0434 09/12/17 0451  BP: 138/63 125/73 136/72   Pulse: 90 81 79   Resp: 18 20 20    Temp: 97.7 F (36.5 C) 98.4 F (36.9 C)  98 F (36.7 C)  TempSrc: Oral Oral  Oral  SpO2: 97% 98% 97%   Weight:      Height:        PHYSICAL EXAM:  Compartment soft +grip, sensation intact distally, able to flex and extend elbow, 2+radial pulse Dressing c/d/i  LABS  Results for orders placed or performed during the hospital encounter of 09/11/17 (from the past 24 hour(s))  ABO/Rh     Status: None   Collection Time: 09/11/17  8:50 AM  Result Value Ref Range   ABO/RH(D)      O POS Performed at [facility], [address] [place], [place] [address]   Hemoglobin and hematocrit, blood     Status: Abnormal   Collection Time: 09/12/17  4:28 AM  Result Value Ref Range   Hemoglobin 13.0 13.0 - 18.0 g/dL   HCT 60.437.3 (L) 54.040.0 - 98.152.0 %  Basic metabolic panel     Status: Abnormal   Collection Time: 09/12/17  4:28 AM  Result Value Ref Range   Sodium 138 135 - 145 mmol/L   Potassium 3.8 3.5 - 5.1 mmol/L   Chloride 106 101 - 111 mmol/L   CO2 25 22 - 32 mmol/L   Glucose, Bld 115 (H) 65 - 99 mg/dL   BUN 15 6 - 20 mg/dL   Creatinine, Ser 1.910.75 0.61 - 1.24 mg/dL   Calcium 8.8 (L) 8.9 - 10.3 mg/dL   GFR calc non Af Amer >60 >60 mL/min   GFR calc Af Amer >60 >60 mL/min   Anion gap 7 5 - 15    Dg Chest 2 View  Result Date: 09/12/2017 CLINICAL DATA:  Follow-up of left pneumothorax. Shoulder surgery yesterday. Chest tube. EXAM: CHEST  2 VIEW COMPARISON:  Yesterday FINDINGS: Both lateral views are suboptimal secondary to patient arm position. Midline trachea. Mild cardiomegaly. Atherosclerosis in the transverse aorta. Small bilateral pleural effusions. Placement of a small bore left-sided chest tube. Significant improvement in left-sided  pneumothorax. Residual approximately 10% apical component. Mild bibasilar atelectasis. IMPRESSION: Placement of left-sided small bore chest tube/drainage catheter. Significant improvement in approximately 10% left apical pneumothorax. Small bilateral pleural effusions with mild bibasilar atelectasis. Aortic Atherosclerosis (ICD10-I70.0). Electronically Signed   By: Jeronimo GreavesKyle  Talbot M.D.   On: 09/12/2017 07:39   Dg Chest Port 1 View  Result Date: 09/11/2017 CLINICAL DATA:  77 y/o M; status post shoulder replacement, evaluate for pneumothorax. EXAM: PORTABLE CHEST 1 VIEW COMPARISON:  None. FINDINGS: Normal size cardiac silhouette. Aortic atherosclerosis with calcification. Large left pneumothorax and partial collapse of the left lung. Elevated left hemidiaphragm. Clear right lung. Postsurgical changes related to left proximal humerus replacement partially visualized with air in soft tissues and skin staples. IMPRESSION: Large left pneumothorax, partial collapse of left lung, elevated left hemidiaphragm. Critical Value/emergent results were called by telephone at the time of interpretation on 09/11/2017 at 2:13 pm to Dr. Yves DillPAUL CARROLL , who verbally acknowledged these results. Electronically Signed   By: Mitzi HansenLance  Furusawa-Stratton M.D.   On: 09/11/2017 14:22   Dg Shoulder Left Port  Result Date: 09/11/2017 CLINICAL DATA:  Postop day 0 left total  shoulder joint replacement. EXAM: LEFT SHOULDER - 1 VIEW COMPARISON:  None in PACs FINDINGS: There is an abnormal appearance of the left lung with a pleural line and some consolidated lung centrally worrisome for an approximately 60% pneumothorax. The patient has undergone left total shoulder joint prosthesis placement. Radiographic positioning of the prosthetic components is good. The interface with the native bone appears normal. No acute native bone fracture is observed. There are small amounts of subcutaneous gas present. IMPRESSION: Proximally 60% left-sided pneumothorax. No  immediate complication involving the left shoulder joint replacement per se. The report was called by me to Eve, R.N. in PACU at 1:53 p.m. on September 11, 2017. Electronically Signed   By: David  [place]az[place] M.D.   On: 09/11/2017 13:56    Assessment/Plan: 1 Day Post-Op   Active Problems:   S/P shoulder replacement, left Pneumothorax s/p chest tube placement  Up with therapy, appreciate management  Start OT today conservative program  Lyndle Herrlich , MD 09/12/2017, 8:16 AM

## 2017-09-12 NOTE — Evaluation (Signed)
Occupational Therapy Evaluation Patient Details Name: Joel Mcintosh MRN: [id number] DOB: Feb 14, 1941 Today's Date: 09/12/2017    History of Present Illness 77 y.o. male pt who underwent a elective procedure today for L total arthroplasty on 09/11/17.  When he came through from anesthesia he complained of some chest pain and shortness of breath.  Chest x-ray showed a large left pneumothorax, chest tube placed.     Clinical Impression   Pt seen for OT evaluation this date. Daughter present for session. Pt was independent at baseline with all aspects of mobility and ADL. Had increasing pain and difficulty with UB dressing due to L shoulder pain but was able to do without assist. Pt presents with L shoulder pain, intact sensation, L elbow/wrist/hand ROM WFL, and in need of assist for UB ADL tasks. Pt/dtr instructed in shoulder precautions based on conservative shoulder protocol per OT order, adaptive strategies for UB ADL to maintain precautions, and sling positioning/mgt. Pt was able to perform bed mobility with modified independence and transfers with close supervision to min guard to ensure chest tube remained in place. Pt able to march in place without LOB/difficulty.  Further mobility efforts limited by chest tube. Pt will benefit from skilled OT Services while in the hospital to address noted impairments and functional deficits and will benefit from follow up therapy as arranged by the surgeon. Pt raised question about AROM of shoulder. OT will follow up with surgeon and touch base with pt.     Follow Up Recommendations  DC plan and follow up therapy as arranged by surgeon    Equipment Recommendations  None recommended by OT    Recommendations for Other Services       Precautions / Restrictions Precautions Precautions: Shoulder Shoulder Interventions: Shoulder sling/immobilizer;Off for dressing/bathing/exercises;At all times(sling, no immobilizer) Restrictions Weight Bearing Restrictions:  Yes LUE Weight Bearing: Non weight bearing Other Position/Activity Restrictions: No A/PROM L shoulder, AROM elbow/wrist/hand to tolerate, LUE NWBing, sling on at all times except dressing/bathing/exercises      Mobility Bed Mobility Overal bed mobility: Modified Independent             General bed mobility comments: good effort and safety awareness  Transfers Overall transfer level: Needs assistance Equipment used: None Transfers: Sit to/from Stand Sit to Stand: Supervision         General transfer comment: supervision to min guard only due to chest tube in place, no LOB noted    Balance Overall balance assessment: No apparent balance deficits (not formally assessed)                                         ADL either performed or assessed with clinical judgement   ADL Overall ADL's : Needs assistance/impaired Eating/Feeding: Independent;Sitting   Grooming: Independent   Upper Body Bathing: Sitting;Cueing for UE precautions;With caregiver independent assisting;Minimal assistance   Lower Body Bathing: Sit to/from stand;Supervison/ safety   Upper Body Dressing : Sitting;Cueing for UE precautions;Minimal assistance;Moderate assistance Upper Body Dressing Details (indicate cue type and reason): pt educated in 1 handed strategies for dressing and preferred types of shirts to put on to maximize adherence to precautions Lower Body Dressing: Sit to/from stand;Supervision/safety Lower Body Dressing Details (indicate cue type and reason): increased time/effort but able to do, SBA when in standing Toilet Transfer: Regular Toilet;Ambulation;Min guard Toilet Transfer Details (indicate cue type and reason): limited in distance only  due to chest tube         Functional mobility during ADLs: Min guard;Supervision/safety(min guard due to chest tube, otherwise would be supervision for ambulation)       Vision Baseline Vision/History: Wears glasses Wears  Glasses: At all times Patient Visual Report: No change from baseline       Perception     Praxis      Pertinent Vitals/Pain Pain Assessment: 0-10 Pain Score: 5  Pain Location: L shoulder Pain Descriptors / Indicators: Aching Pain Intervention(s): Limited activity within patient's tolerance;Monitored during session;Premedicated before session;Repositioned     Hand Dominance Right   Extremity/Trunk Assessment Upper Extremity Assessment Upper Extremity Assessment: LUE deficits/detail(RUE WFL) LUE Deficits / Details: elbow/wrist/hand ROM WFL, notes slight tingling in fingertips in B hands at baseline, intact sensation otherwise LUE: Unable to fully assess due to immobilization LUE Coordination: decreased gross motor   Lower Extremity Assessment Lower Extremity Assessment: Overall WFL for tasks assessed   Cervical / Trunk Assessment Cervical / Trunk Assessment: Normal   Communication Communication Communication: No difficulties   Cognition Arousal/Alertness: Awake/alert Behavior During Therapy: WFL for tasks assessed/performed Overall Cognitive Status: Within Functional Limits for tasks assessed                                     General Comments  sling in place at end of session, chest tube in place    Exercises Other Exercises Other Exercises: Pt/dtr educated in conservative protocol for shoulder (see precautions noted above) Other Exercises: Pt/dtr educated in adaptive strategies for ADL tasks while maintaining shoulder precautions   Shoulder Instructions      Home Living Family/patient expects to be discharged to:: Private residence Living Arrangements: Children(daughter lives with him right now) Available Help at Discharge: Family;Available 24 hours/day Type of Home: House Home Access: Stairs to enter [employer] of Steps: 2 Entrance Stairs-Rails: None Home Layout: Two level;Able to live on main level with bedroom/bathroom      Bathroom Shower/Tub: Tub/shower unit;Walk-in shower   Bathroom Toilet: Standard     Home Equipment: Other (comment)(adjustable bed)          Prior Functioning/Environment Level of Independence: Independent        Comments: Pt indep with mobility, driving, ADL, active; endorses increased discomfort with dressing tasks due to L shoulder pain but still able to perform without assist. No falls history.         OT Problem List: Decreased strength;Decreased range of motion;Decreased knowledge of precautions;Impaired UE functional use;Pain      OT Treatment/Interventions: Self-care/ADL training;Therapeutic exercise;Therapeutic activities;DME and/or AE instruction;Patient/family education    OT Goals(Current goals can be found in the care plan section) Acute Rehab OT Goals Patient Stated Goal: go home and recover OT Goal Formulation: With patient/family Time For Goal Achievement: 09/26/17 Potential to Achieve Goals: Good ADL Goals Pt Will Perform Upper Body Dressing: with caregiver independent in assisting;with min assist;sitting Additional ADL Goal #1: Pt will verbalize understanding of all L shoulder precautions with no verbal cues to maximize safety during recovery. Additional ADL Goal #2: Pt/family will demonstrate proper L shoulder sling placement and mgt to support optimal recovery.  OT Frequency: Min 1X/week   Barriers to D/C:            Co-evaluation              AM-PAC PT "6 Clicks" Daily Activity  Outcome Measure Help from another person eating meals?: None Help from another person taking care of personal grooming?: None Help from another person toileting, which includes using toliet, bedpan, or urinal?: A Little Help from another person bathing (including washing, rinsing, drying)?: A Little Help from another person to put on and taking off regular upper body clothing?: A Little Help from another person to put on and taking off regular lower body  clothing?: A Little 6 Click Score: 20   End of Session    Activity Tolerance: Patient tolerated treatment well Patient left: in bed;with call bell/phone within reach;with bed alarm set;with family/visitor present;Other (comment)(sling and chest tube in place)  OT Visit Diagnosis: Pain Pain - Right/Left: Left Pain - part of body: Shoulder                Time: 1610-9604 OT Time Calculation (min): 28 min Charges:  OT General Charges $OT Visit: 1 Visit OT Evaluation $OT Eval Low Complexity: 1 Low OT Treatments $Self Care/Home Management : 8-22 mins  Richrd Prime, MPH, MS, OTR/L ascom [phone] 09/12/17, 1:34 PM

## 2017-09-12 NOTE — Progress Notes (Signed)
Pt noted to have small intermittent air leak after ambulating to br and trying to have a BM. I noticed after a few minutes the air leak seemed to have gone away. Notified OR nurse who will notify MD after he is done with surgery.

## 2017-09-12 NOTE — Progress Notes (Signed)
Did well overnight.  Not short of breath.  Pain is under good control.  Excellent apetite  Wounds are clean and dry  No air leak  Independent review of CXRay shows no pneumothorax  Would leave on suction today and if no air leak in the morning change to water seal  May be off suction for ambulation in halls.

## 2017-09-12 NOTE — Progress Notes (Signed)
[facility] [place] [facility]   PATIENT NAME: Joel Mcintosh    MR#:  [id number]  DATE OF BIRTH:  19-Oct-1940  SUBJECTIVE:  Doing well. Some left shoulder pain  REVIEW OF SYSTEMS:   Review of Systems  Constitutional: Negative for chills, fever and weight loss.  HENT: Negative for ear discharge, ear pain and nosebleeds.   Eyes: Negative for blurred vision, pain and discharge.  Respiratory: Negative for sputum production, shortness of breath, wheezing and stridor.   Cardiovascular: Negative for chest pain, palpitations, orthopnea and PND.  Gastrointestinal: Negative for abdominal pain, diarrhea, nausea and vomiting.  Genitourinary: Negative for frequency and urgency.  Musculoskeletal: Positive for joint pain. Negative for back pain.  Neurological: Positive for weakness. Negative for sensory change, speech change and focal weakness.  Psychiatric/Behavioral: Negative for depression and hallucinations. The patient is not nervous/anxious.    Tolerating Diet:yes Tolerating PT: pending  DRUG ALLERGIES:  No Known Allergies  VITALS:  Blood pressure 136/72, pulse 79, temperature 98 F (36.7 C), temperature source Oral, resp. rate 20, height 5\' 10"  (1.778 m), weight 76.7 kg (169 lb), SpO2 97 %.  PHYSICAL EXAMINATION:   Physical Exam  GENERAL:  77 y.o.-year-old patient lying in the bed with no acute distress.  EYES: Pupils equal, round, reactive to light and accommodation. No scleral icterus. Extraocular muscles intact.  HEENT: Head atraumatic, normocephalic. Oropharynx and nasopharynx clear.  NECK:  Supple, no jugular venous distention. No thyroid enlargement, no tenderness.  LUNGS: Normal breath sounds bilaterally, no wheezing, rales, rhonchi. No use of accessory muscles of respiration. Left chest tube + CARDIOVASCULAR: S1, S2 normal. No murmurs, rubs, or gallops.  ABDOMEN: Soft, nontender, nondistended. Bowel sounds present. No organomegaly or mass.   EXTREMITIES: No cyanosis, clubbing or edema b/l.    NEUROLOGIC: Cranial nerves II through XII are intact. No focal Motor or sensory deficits b/l.   PSYCHIATRIC:  patient is alert and oriented x 3.  SKIN: No obvious rash, lesion, or ulcer.   LABORATORY PANEL:  CBC Recent Labs  Lab 09/12/17 0428  HGB 13.0  HCT 37.3*    Chemistries  Recent Labs  Lab 09/12/17 0428  NA 138  K 3.8  CL 106  CO2 25  GLUCOSE 115*  BUN 15  CREATININE 0.75  CALCIUM 8.8*   Cardiac Enzymes No results for input(s): TROPONINI in the last 168 hours. RADIOLOGY:  Dg Chest 2 View  Result Date: 09/12/2017 CLINICAL DATA:  Follow-up of left pneumothorax. Shoulder surgery yesterday. Chest tube. EXAM: CHEST  2 VIEW COMPARISON:  Yesterday FINDINGS: Both lateral views are suboptimal secondary to patient arm position. Midline trachea. Mild cardiomegaly. Atherosclerosis in the transverse aorta. Small bilateral pleural effusions. Placement of a small bore left-sided chest tube. Significant improvement in left-sided pneumothorax. Residual approximately 10% apical component. Mild bibasilar atelectasis. IMPRESSION: Placement of left-sided small bore chest tube/drainage catheter. Significant improvement in approximately 10% left apical pneumothorax. Small bilateral pleural effusions with mild bibasilar atelectasis. Aortic Atherosclerosis (ICD10-I70.0). Electronically Signed   By: Jeronimo Greaves M.D.   On: 09/12/2017 07:39   Dg Chest Port 1 View  Result Date: 09/11/2017 CLINICAL DATA:  77 y/o M; status post shoulder replacement, evaluate for pneumothorax. EXAM: PORTABLE CHEST 1 VIEW COMPARISON:  None. FINDINGS: Normal size cardiac silhouette. Aortic atherosclerosis with calcification. Large left pneumothorax and partial collapse of the left lung. Elevated left hemidiaphragm. Clear right lung. Postsurgical changes related to left proximal humerus replacement partially visualized with air in soft tissues  and skin staples. IMPRESSION:  Large left pneumothorax, partial collapse of left lung, elevated left hemidiaphragm. Critical Value/emergent results were called by telephone at the time of interpretation on 09/11/2017 at 2:13 pm to Dr. Yves DillPAUL CARROLL , who verbally acknowledged these results. Electronically Signed   By: Mitzi HansenLance  Furusawa-Stratton M.D.   On: 09/11/2017 14:22   Dg Shoulder Left Port  Result Date: 09/11/2017 CLINICAL DATA:  Postop day 0 left total shoulder joint replacement. EXAM: LEFT SHOULDER - 1 VIEW COMPARISON:  None in PACs FINDINGS: There is an abnormal appearance of the left lung with a pleural line and some consolidated lung centrally worrisome for an approximately 60% pneumothorax. The patient has undergone left total shoulder joint prosthesis placement. Radiographic positioning of the prosthetic components is good. The interface with the native bone appears normal. No acute native bone fracture is observed. There are small amounts of subcutaneous gas present. IMPRESSION: Proximally 60% left-sided pneumothorax. No immediate complication involving the left shoulder joint replacement per se. The report was called by me to Eve, R.N. in PACU at 1:53 p.m. on September 11, 2017. Electronically Signed   By: David  SwazilandJordan M.D.   On: 09/11/2017 13:56   Ct Image Guided Drainage By Percutaneous Catheter  Result Date: 09/12/2017 CLINICAL DATA:  Left pneumothorax following left shoulder arthroplasty. EXAM: CT GUIDED LEFT CHEST THORACOSTOMY TUBE PLACEMENT ANESTHESIA/SEDATION: 2.0 mg IV Versed 50 mcg IV Fentanyl Total Moderate Sedation Time:  21 minutes The patient's level of consciousness and physiologic status were continuously monitored during the procedure by Radiology nursing. PROCEDURE: The procedure, risks, benefits, and alternatives were explained to the patient. Questions regarding the procedure were encouraged and answered. The patient understands and consents to the procedure. A time out was performed prior to initiating the  procedure. CT of the chest was performed in a supine position. The left anterior chest wall was prepped with chlorhexidine in a sterile fashion, and a sterile drape was applied covering the operative field. A sterile gown and sterile gloves were used for the procedure. Local anesthesia was provided with 1% Lidocaine. Under CT guidance, an 18 gauge trocar needle was advanced into the anterior left pleural space. After confirming needle tip position, a guidewire was advanced. The tract was dilated and a 12 French pigtail drainage catheter advanced over the wire. Catheter position was confirmed by CT. The catheter was connected to a Pleur-evac device. The catheter was then secured at the skin with a Prolene retention suture, StatLock device and Vaseline gauze dressing. COMPLICATIONS: None FINDINGS: Initial CT demonstrates a large left pneumothorax with near complete collapse of the left lung. The thoracostomy tube was advanced into the anterior pleural space. IMPRESSION: Placement of 12 French left-sided pigtail thoracostomy tube to treat a large left pneumothorax. The tube was attached to a Pleur-evac device which will be attached to wall suction at -20 cm of water. Electronically Signed   By: Irish LackGlenn  Yamagata M.D.   On: 09/12/2017 09:30   ASSESSMENT AND PLAN:  Luvenia StarchCharles Eaves  is a 77 y.o. male who underwent a elective procedure today for left total arthroplasty.  When he came through from anesthesia he complained of some chest pain and shortness of breath.  Chest x-ray showed a large left pneumothorax.  EKG was unremarkable.  Interventional radiology was consulted for chest tube placement  1.  Postoperative pneumothorax.  I believe this is the cause of the patient's chest pain and shortness of breath.  Interventional radiology placed a  Left sided chest tube yday.  -  feels a lot better -followed by Dr Earlene Plater (surgery)  2.  Essential hypertension continue losartan and Norvasc -stable  3.  Restless leg syndrome  trial of Requip 0.25 mg nightly  4.  History of sleep apnea  5.  BPH on Cardura  6. Elective Left shoulder surgeryfor severe DJD by Dr Odis Luster - Pain control as per surgery  IM will sign off. Call if needed. Dr Earlene Plater informed  Case discussed with Care Management/Social Worker. Management plans discussed with the patient, family and they are in agreement.  CODE STATUS: FULL  DVT Prophylaxis: lovenox  TOTAL TIME TAKING CARE OF THIS PATIENT: 30 minutes.  >50% time spent on counselling and coordination of care  Note: This dictation was prepared with Dragon dictation along with smaller phrase technology. Any transcriptional errors that result from this process are unintentional.  Enedina Finner M.D on 09/12/2017 at 11:35 AM  Between 7am to 6pm - Pager - [phone]  After 6pm go to [email or web address] - password [email or web address]  [facility]  Office  [phone]  CC: Primary care physician; Carmin Richmond, MDPatient ID: Markham Jordan, male   DOB: 04-20-1941, 77 y.o.   MRN: [id number]

## 2017-09-13 ENCOUNTER — Inpatient Hospital Stay: Payer: Medicare Other

## 2017-09-13 DIAGNOSIS — J95811 Postprocedural pneumothorax: Secondary | ICD-10-CM

## 2017-09-13 DIAGNOSIS — J9383 Other pneumothorax: Secondary | ICD-10-CM

## 2017-09-13 MED ORDER — SIMETHICONE 40 MG/0.6ML PO SUSP
80.0000 mg | Freq: Four times a day (QID) | ORAL | Status: DC | PRN
  Administered 2017-09-13: 80 mg via ORAL
  Filled 2017-09-13 (×2): qty 1.2

## 2017-09-13 NOTE — Progress Notes (Cosign Needed)
Occupational Therapy Treatment Patient Details Name: Joel Mcintosh MRN: [id number] DOB: October 17, 1940 Today's Date: 09/13/2017    History of present illness 77 y.o. male pt who underwent a elective procedure today for L total arthroplasty on 09/11/17.  When he came through from anesthesia he complained of some chest pain and shortness of breath.  Chest x-ray showed a large left pneumothorax, chest tube placed.     OT comments  Pt seen for OT treatment this date focused on progression of the conservative shoulder protocol. Pt instructed in hemi techniques and able to return demo donning a button up shirt with verbal cues for L shoulder precautions and min assist to don sleeve over R wrist due to hospital wristbands. Provided PROM for shoulder FF and ER per Dr. Maxcine Ham instructions. Pt tolerated treatment will. Will continue to progress.    Follow Up Recommendations  DC plan and follow up therapy as arranged by surgeon    Equipment Recommendations  None recommended by OT    Recommendations for Other Services      Precautions / Restrictions Precautions Precautions: Shoulder Shoulder Interventions: Shoulder sling/immobilizer;Off for dressing/bathing/exercises;At all times(sling, no immobilizer) Restrictions Weight Bearing Restrictions: Yes LUE Weight Bearing: Non weight bearing Other Position/Activity Restrictions: AROM elbow/wrist/hand to tolerate, LUE NWBing, sling on at all times except dressing/bathing/exercises       Mobility Bed Mobility               General bed mobility comments: deferred, up in recliner  Transfers Overall transfer level: Needs assistance Equipment used: None Transfers: Sit to/from Stand Sit to Stand: Supervision              Balance Overall balance assessment: No apparent balance deficits (not formally assessed)                                         ADL either performed or assessed with clinical judgement   ADL Overall  ADL's : Needs assistance/impaired                 Upper Body Dressing : Set up;Sitting;Minimal assistance Upper Body Dressing Details (indicate cue type and reason): instructed in hemi techniques for UB dressing, pt able to return demo technique with min assist to assist with donning over hospital wrist bands and IV access site, VC for relaxing L shoulder                         Vision Baseline Vision/History: Wears glasses Wears Glasses: At all times Patient Visual Report: No change from baseline     Perception     Praxis      Cognition Arousal/Alertness: Awake/alert Behavior During Therapy: WFL for tasks assessed/performed Overall Cognitive Status: Within Functional Limits for tasks assessed                                          Exercises Shoulder Exercises Shoulder Flexion: PROM;Left;Seated;Other reps (comment)(10x3 with rest breaks, PROM FF 0-90) Shoulder External Rotation: PROM;Other reps (comment);Left;Seated(PROM, ER, 0-20 degrees, 10x3 with rest breaks) Elbow Flexion: AROM;10 reps;Left;Seated Elbow Extension: AROM;Seated;Left;10 reps Hand Exercises Forearm Supination: AROM;10 reps;Left;Seated Forearm Pronation: AROM;10 reps;Left;Seated Other Exercises Other Exercises: Pt instructed in AROM LUE shoulder FF as instructed by Dr. Odis Luster   Shoulder  Instructions       General Comments      Pertinent Vitals/ Pain       Pain Assessment: 0-10 Pain Score: 6  Pain Location: L shoulder with movement Pain Descriptors / Indicators: Aching Pain Intervention(s): Limited activity within patient's tolerance;Monitored during session;Premedicated before session;Repositioned  Home Living                                          Prior Functioning/Environment              Frequency  Min 1X/week        Progress Toward Goals  OT Goals(current goals can now be found in the care plan section)  Progress towards OT  goals: Progressing toward goals  Acute Rehab OT Goals Patient Stated Goal: go home and recover OT Goal Formulation: With patient/family Time For Goal Achievement: 09/26/17 Potential to Achieve Goals: Good  Plan Discharge plan remains appropriate;Frequency remains appropriate    Co-evaluation                 AM-PAC PT "6 Clicks" Daily Activity     Outcome Measure   Help from another person eating meals?: None Help from another person taking care of personal grooming?: None Help from another person toileting, which includes using toliet, bedpan, or urinal?: A Little Help from another person bathing (including washing, rinsing, drying)?: A Little Help from another person to put on and taking off regular upper body clothing?: A Little Help from another person to put on and taking off regular lower body clothing?: A Little 6 Click Score: 20    End of Session    OT Visit Diagnosis: Pain Pain - Right/Left: Left Pain - part of body: Shoulder   Activity Tolerance Patient tolerated treatment well   Patient Left in chair;with call bell/phone within reach;with chair alarm set;Other (comment)(L chest tube and L shoulder sling in place)   Nurse Communication Precautions        Time: [id number] OT Time Calculation (min): 29 min  Charges: OT General Charges $OT Visit: 1 Visit OT Treatments $Self Care/Home Management : 8-22 mins $Therapeutic Exercise: 8-22 mins   Richrd PrimeJamie Stiller, MPH, MS, OTR/L ascom [phone] 09/13/17, 5:25 PM

## 2017-09-13 NOTE — Progress Notes (Signed)
Subjective:  Patient reports pain as mild.    Objective:   VITALS:   Vitals:   09/12/17 0845 09/12/17 1515 09/12/17 2116 09/13/17 0447  BP: 140/61 (!) 145/72 (!) 158/69 (!) 159/71  Pulse: 75 75 68 73  Resp: 15 14 20 18   Temp: 98.7 F (37.1 C) 98.5 F (36.9 C) 98.1 F (36.7 C) 98.4 F (36.9 C)  TempSrc: Oral Oral Oral Oral  SpO2: 97% 99% 98% 99%  Weight:      Height:        PHYSICAL EXAM:  Sensation intact distally Incision: no drainage Compartment soft  LABS  No results found for this or any previous visit (from the past 24 hour(s)).  Dg Chest 1 View  Result Date: 09/12/2017 CLINICAL DATA:  Follow-up pneumothorax. EXAM: CHEST 1 VIEW COMPARISON:  Earlier today FINDINGS: Stable positioning of small bore chest tube at the left base. Elevated left diaphragm. No visible residual pneumothorax. Stable soft tissue emphysema in the left supraclavicular fossa. Normal heart size. Clear right lung. IMPRESSION: 1. No visible residual pneumothorax. Stable soft tissue emphysema in the left supraclavicular fossa. 2. Improving atelectasis at the left base.  Elevated left diaphragm. Electronically Signed   By: Marnee SpringJonathon  Watts M.D.   On: 09/12/2017 15:07   Dg Chest 2 View  Result Date: 09/12/2017 CLINICAL DATA:  Follow-up of left pneumothorax. Shoulder surgery yesterday. Chest tube. EXAM: CHEST  2 VIEW COMPARISON:  Yesterday FINDINGS: Both lateral views are suboptimal secondary to patient arm position. Midline trachea. Mild cardiomegaly. Atherosclerosis in the transverse aorta. Small bilateral pleural effusions. Placement of a small bore left-sided chest tube. Significant improvement in left-sided pneumothorax. Residual approximately 10% apical component. Mild bibasilar atelectasis. IMPRESSION: Placement of left-sided small bore chest tube/drainage catheter. Significant improvement in approximately 10% left apical pneumothorax. Small bilateral pleural effusions with mild bibasilar atelectasis.  Aortic Atherosclerosis (ICD10-I70.0). Electronically Signed   By: Jeronimo GreavesKyle  Talbot M.D.   On: 09/12/2017 07:39   Dg Chest Port 1 View  Result Date: 09/11/2017 CLINICAL DATA:  77 y/o M; status post shoulder replacement, evaluate for pneumothorax. EXAM: PORTABLE CHEST 1 VIEW COMPARISON:  None. FINDINGS: Normal size cardiac silhouette. Aortic atherosclerosis with calcification. Large left pneumothorax and partial collapse of the left lung. Elevated left hemidiaphragm. Clear right lung. Postsurgical changes related to left proximal humerus replacement partially visualized with air in soft tissues and skin staples. IMPRESSION: Large left pneumothorax, partial collapse of left lung, elevated left hemidiaphragm. Critical Value/emergent results were called by telephone at the time of interpretation on 09/11/2017 at 2:13 pm to Dr. Yves DillPAUL CARROLL , who verbally acknowledged these results. Electronically Signed   By: Mitzi HansenLance  Furusawa-Stratton M.D.   On: 09/11/2017 14:22   Dg Shoulder Left Port  Result Date: 09/11/2017 CLINICAL DATA:  Postop day 0 left total shoulder joint replacement. EXAM: LEFT SHOULDER - 1 VIEW COMPARISON:  None in PACs FINDINGS: There is an abnormal appearance of the left lung with a pleural line and some consolidated lung centrally worrisome for an approximately 60% pneumothorax. The patient has undergone left total shoulder joint prosthesis placement. Radiographic positioning of the prosthetic components is good. The interface with the native bone appears normal. No acute native bone fracture is observed. There are small amounts of subcutaneous gas present. IMPRESSION: Proximally 60% left-sided pneumothorax. No immediate complication involving the left shoulder joint replacement per se. The report was called by me to Eve, R.N. in PACU at 1:53 p.m. on September 11, 2017. Electronically Signed   By:  David  [place] M.D.   On: 09/11/2017 13:56   Ct Image Guided Drainage By Percutaneous Catheter  Result Date:  09/12/2017 CLINICAL DATA:  Left pneumothorax following left shoulder arthroplasty. EXAM: CT GUIDED LEFT CHEST THORACOSTOMY TUBE PLACEMENT ANESTHESIA/SEDATION: 2.0 mg IV Versed 50 mcg IV Fentanyl Total Moderate Sedation Time:  21 minutes The patient's level of consciousness and physiologic status were continuously monitored during the procedure by Radiology nursing. PROCEDURE: The procedure, risks, benefits, and alternatives were explained to the patient. Questions regarding the procedure were encouraged and answered. The patient understands and consents to the procedure. A time out was performed prior to initiating the procedure. CT of the chest was performed in a supine position. The left anterior chest wall was prepped with chlorhexidine in a sterile fashion, and a sterile drape was applied covering the operative field. A sterile gown and sterile gloves were used for the procedure. Local anesthesia was provided with 1% Lidocaine. Under CT guidance, an 18 gauge trocar needle was advanced into the anterior left pleural space. After confirming needle tip position, a guidewire was advanced. The tract was dilated and a 12 French pigtail drainage catheter advanced over the wire. Catheter position was confirmed by CT. The catheter was connected to a Pleur-evac device. The catheter was then secured at the skin with a Prolene retention suture, StatLock device and Vaseline gauze dressing. COMPLICATIONS: None FINDINGS: Initial CT demonstrates a large left pneumothorax with near complete collapse of the left lung. The thoracostomy tube was advanced into the anterior pleural space. IMPRESSION: Placement of 12 French left-sided pigtail thoracostomy tube to treat a large left pneumothorax. The tube was attached to a Pleur-evac device which will be attached to wall suction at -20 cm of water. Electronically Signed   By: Irish Lack M.D.   On: 09/12/2017 09:30    Assessment/Plan: 2 Days Post-Op   Active Problems:   S/P  shoulder replacement, left   Pneumothorax on left   Up with therapy  Appreciate PTX management   Lyndle Herrlich , MD 09/13/2017, 9:13 AM

## 2017-09-13 NOTE — Progress Notes (Signed)
SURGICAL PROGRESS NOTE (cpt [id number])  Hospital Day(s): 2.   Post op day(s): 2 Days Post-Op.   Interval History: Patient seen and examined, no acute events or new complaints overnight. Patient reports breathing well with minimal Left chest pain, otherwise denies SOB, N/V, fever/chills, and has been ambulating to the bathtroom.  Review of Systems:  Constitutional: denies fever, chills  HEENT: denies cough or congestion  Respiratory: denies any shortness of breath Cardiovascular: denies palpitations or CP except as per HPI Gastrointestinal: denies abdominal pain, N/V, or diarrhea Genitourinary: denies burning with urination or urinary frequency Musculoskeletal: denies pain, decreased motor or sensation Integumentary: denies any other rashes or skin discolorations except post-surgical Left shoulder dressing and chest tube site Neurological: denies HA or vision/hearing changes   Vital signs in last 24 hours: [min-max] current  Temp:  [98.1 F (36.7 C)-98.5 F (36.9 C)] 98.2 F (36.8 C) (02/06 0924) Pulse Rate:  [68-82] 82 (02/06 0924) Resp:  [14-20] 18 (02/06 0924) BP: (134-159)/(55-72) 134/55 (02/06 0924) SpO2:  [96 %-99 %] 96 % (02/06 0950)     Height: 5\' 10"  (177.8 cm) Weight: 169 lb (76.7 kg) BMI (Calculated): 24.25   Intake/Output this shift:  Total I/O In: 480 [P.O.:480] Out: -    Intake/Output last 2 shifts:  @IOLAST2SHIFTS @   Physical Exam:  Constitutional: alert, cooperative and no distress  HENT: normocephalic without obvious abnormality  Eyes: PERRL, EOM's grossly intact and symmetric  Neuro: CN II - XII grossly intact and symmetric without deficit  Respiratory: breathing non-labored at rest  Cardiovascular: regular rate and sinus rhythm  Gastrointestinal: soft, non-tender, and non-distended Musculoskeletal: UE and LE FROM, no edema or wounds except Left shoulder post-surgical dressing c/d/i with minimal tenderness to palpation, motor and sensation grossly intact,  NT   Labs:  CBC Latest Ref Rng & Units 09/12/2017 08/30/2017  WBC 3.8 - 10.6 K/uL - 5.8  Hemoglobin 13.0 - 18.0 g/dL 60.413.0 54.014.1  Hematocrit 98.140.0 - 52.0 % 37.3(L) 40.7  Platelets 150 - 440 K/uL - 177   CMP Latest Ref Rng & Units 09/12/2017 08/30/2017  Glucose 65 - 99 mg/dL 191(Y115(H) 782(N119(H)  BUN 6 - 20 mg/dL 15 15  Creatinine 5.620.61 - 1.24 mg/dL 1.300.75 8.650.77  Sodium 784135 - 145 mmol/L 138 138  Potassium 3.5 - 5.1 mmol/L 3.8 3.9  Chloride 101 - 111 mmol/L 106 104  CO2 22 - 32 mmol/L 25 26  Calcium 8.9 - 10.3 mg/dL 6.9(G8.8(L) 9.4    Imaging studies: Chest X-ray (09/13/2017) - personally reviewed and discussed with patient, his family, and with Dr. Thelma Bargeaks Again noted is elevation of the left hemidiaphragm. Increased volume loss at the left chest base. Pigtail catheter is coiled at the left chest base. There is not a definite pneumothorax. Few linear densities near the left lung apex are probably related to a rib. There is atelectasis in the right lower chest. Trachea is midline. Atherosclerotic calcifications at the aortic arch. Heart size is normal. Left shoulder arthroplasty.  Assessment/Plan: (ICD-10's: J93.83) 77 y.o. male with 3960 - 70% Left pneumothorax without significant visible pleural fluid component s/p Left total shoulder arthoplasty under general anesthesia with intrascalene nerve block, complicated by pertinent comorbidities including HTN, HLD, OSA, bradycardia, CKD, and osteoarthritis.              -  placed Left chest tube to waterseal (off suction)             -  will follow-up am CXR unless symptomatic sooner             -  pain control prn and medical management as per primary and medical teams  - if no recurrent pneumothorax on CXR tomorrow, will plan to remove chest tube  - after chest tube removed, will plan to repeat CXR a few hours later unless symptomatic sooner  - if no recurrent PTX on CXR after chest tube removed, anticipate discharge planning             - DVT prophylaxis,  ambulation encouraged  All of the above findings and recommendations were discussed with the patient, patient's family, and patient's RN, and all of patient's and family's questions were answered to their expressed satisfaction.  Thank you for the opportunity to participate in this patient's care.  -- Scherrie Gerlach Earlene Plater, MD, RPVI [place]: [facility] - Partnering for exceptional care. Office: [phone]

## 2017-09-14 ENCOUNTER — Telehealth: Payer: Self-pay

## 2017-09-14 ENCOUNTER — Inpatient Hospital Stay: Payer: Medicare Other

## 2017-09-14 DIAGNOSIS — J939 Pneumothorax, unspecified: Secondary | ICD-10-CM

## 2017-09-14 DIAGNOSIS — J9312 Secondary spontaneous pneumothorax: Secondary | ICD-10-CM

## 2017-09-14 MED ORDER — HYDROCODONE-ACETAMINOPHEN 5-325 MG PO TABS: 1.0000 | ORAL_TABLET | ORAL | 0 refills | Status: AC | PRN

## 2017-09-14 MED ORDER — DOCUSATE SODIUM 100 MG PO CAPS: 100.0000 mg | ORAL_CAPSULE | Freq: Every day | ORAL | 2 refills | Status: AC | PRN

## 2017-09-14 NOTE — Progress Notes (Signed)
  Patient ID: Joel Mcintosh, male   DOB: 01-23-1941, 77 y.o.   MRN: [id number]  HISTORY: He did well overnight without any specific problems.  He states that he does have some discomfort along the chest tube site but no real shortness of breath.  He would like to be discharged today if possible.  He feels well otherwise.  He has been on waterseal for 24 hours repeat chest x-ray today showed no evidence of a change in the very small apical pneumothorax.  There has been no air leak from the chest tube system.   Vitals:   09/14/17 0450 09/14/17 1239  BP: (!) 157/75 (!) 155/74  Pulse: 64 66  Resp: (!) 22 20  Temp: 98.2 F (36.8 C) 98.2 F (36.8 C)  SpO2: 97% 98%     EXAM:    Resp: Lungs are clear bilaterally.  No respiratory distress, normal effort. Heart:  Regular without murmurs Abd:  Abdomen is soft, non distended and non tender. No masses are palpable.  There is no rebound and no guarding.  Neurological: Alert and oriented to person, place, and time. Coordination normal.  Skin: Skin is warm and dry. No rash noted. No diaphoretic. No erythema. No pallor.  Psychiatric: Normal mood and affect. Normal behavior. Judgment and thought content normal.   There is no air leak.  Independent review of the chest x-ray reveals no change in the very small apical pneumothorax.  His chest tube was removed without difficulty.   ASSESSMENT: Left-sided pneumothorax treated with chest tube insertion.  The tube is now been removed.   PLAN:   I have counseled the patient regarding his dressing management.  We discussed how he may develop increasing shortness of breath or chest pain should his pneumothorax recur.  I also gave him my business card and told him that I would like to see him back again in 1 week.  We will obtain a chest x-ray at that time.    Hulda Marinimothy Hersey Maclellan, MD

## 2017-09-14 NOTE — Telephone Encounter (Signed)
Patients daughter notified of appointments below.   [facility] @ 9am for chest xray  Dr.Oaks 10 am  Patient verbalized understanding.

## 2017-09-14 NOTE — Discharge Summary (Signed)
Physician Discharge Summary  Patient ID: Joel Mcintosh MRN: [id number] DOB/AGE: 12/23/1940 77 y.o.  Admit date: 09/11/2017 Discharge date: 09/14/2017  Admission Diagnoses:  M19.012 Primary osteoarthritis, left shoulder <principal problem not specified>  Discharge Diagnoses:  M19.012 Primary osteoarthritis, left shoulder Active Problems:   S/P shoulder replacement, left   Pneumothorax   Past Medical History:  Diagnosis Date  . Arthritis   . Bradycardia   . Chronic kidney disease    renal insufficiency  . Hyperlipidemia   . Hypertension   . Sleep apnea     Surgeries: Procedure(s): TOTAL SHOULDER ARTHROPLASTY on 09/11/2017   Consultants (if any): Treatment Team:  Ancil Linsey, MD Alford Highland, MD  Discharged Condition: Improved  Hospital Course: Joel Mcintosh is an 77 y.o. male who was admitted 09/11/2017 with a diagnosis of  M19.012 Primary osteoarthritis, left shoulder <principal problem not specified> and went to the operating room on 09/11/2017 and underwent the above named procedures.  In the recovery room, an x-ray showed evidence of a pneumothorax on the left side. Surgery was consulted and a chest tube was placed urgently by interventional radiology with near complete resolution of the pneumothorax. He was followed closely and on 09/14/17 the chest tube was removed. He was deemed stable for discharge to home.  He was given perioperative antibiotics:  Anti-infectives (From admission, onward)   Start     Dose/Rate Route Frequency Ordered Stop   09/11/17 1800  ceFAZolin (ANCEF) IVPB 1 g/50 mL premix  Status:  Discontinued     1 g 100 mL/hr over 30 Minutes Intravenous Every 6 hours 09/11/17 1658 09/11/17 1706   09/11/17 1800  ceFAZolin (ANCEF) 1 g in dextrose 5 % 50 mL injection      Intravenous Every 6 hours 09/11/17 1707 09/12/17 0455   09/11/17 1116  50,000 units bacitracin in 0.9% normal saline 250 mL irrigation  Status:  Discontinued       As needed 09/11/17  1116 09/11/17 1254   09/11/17 0834  ceFAZolin (ANCEF) 2-4 GM/100ML-% IVPB    Comments:  Dahlia Bailiff   : cabinet override      09/11/17 0834 09/11/17 2044   09/11/17 0600  ceFAZolin (ANCEF) IVPB 2g/100 mL premix     2 g 200 mL/hr over 30 Minutes Intravenous On call to O.R. 09/10/17 2205 09/11/17 1010    .  He was given sequential compression devices, early ambulation, and ECASA for DVT prophylaxis.  He benefited maximally from the hospital stay and there were no complications.    Recent vital signs:  Vitals:   09/14/17 0450 09/14/17 1239  BP: (!) 157/75 (!) 155/74  Pulse: 64 66  Resp: (!) 22 20  Temp: 98.2 F (36.8 C) 98.2 F (36.8 C)  SpO2: 97% 98%    Recent laboratory studies:  Lab Results  Component Value Date   HGB 13.0 09/12/2017   HGB 14.1 08/30/2017   Lab Results  Component Value Date   WBC 5.8 08/30/2017   PLT 177 08/30/2017   Lab Results  Component Value Date   INR 0.92 08/30/2017   Lab Results  Component Value Date   NA 138 09/12/2017   K 3.8 09/12/2017   CL 106 09/12/2017   CO2 25 09/12/2017   BUN 15 09/12/2017   CREATININE 0.75 09/12/2017   GLUCOSE 115 (H) 09/12/2017    Discharge Medications:   Allergies as of 09/14/2017   No Known Allergies     Medication List  STOP taking these medications   ibuprofen 200 MG tablet Commonly known as:  ADVIL,MOTRIN   naproxen sodium 220 MG tablet Commonly known as:  ALEVE   oxyCODONE-acetaminophen 5-325 MG tablet Commonly known as:  PERCOCET/ROXICET   traMADol 50 MG tablet Commonly known as:  ULTRAM     TAKE these medications   amLODipine 5 MG tablet Commonly known as:  NORVASC Take 5 mg by mouth at bedtime.   aspirin EC 81 MG tablet Take 81 mg by mouth daily.   docusate sodium 100 MG capsule Commonly known as:  COLACE Take 1 capsule (100 mg total) by mouth daily as needed.   doxazosin 4 MG tablet Commonly known as:  CARDURA Take 4 mg by mouth at bedtime.   Fish Oil 1000 MG  Caps Take 1,000 mg by mouth 2 (two) times daily.   GARLIC PO Take 1,610 mg by mouth daily.   HYDROcodone-acetaminophen 5-325 MG tablet Commonly known as:  NORCO/VICODIN Take 1 tablet by mouth every 4 (four) hours as needed for moderate pain.   indomethacin 25 MG capsule Commonly known as:  INDOCIN Take 25 mg by mouth daily as needed (gout flare).   losartan 100 MG tablet Commonly known as:  COZAAR Take 100 mg by mouth daily. AM   Melatonin 5 MG Tabs Take 5 mg by mouth at bedtime.   meloxicam 7.5 MG tablet Commonly known as:  MOBIC Take 7.5 mg by mouth daily.   potassium chloride SA 20 MEQ tablet Commonly known as:  K-DUR,KLOR-CON Take 20 mEq by mouth daily.       Diagnostic Studies: Dg Chest 1 View  Result Date: 09/12/2017 CLINICAL DATA:  Follow-up pneumothorax. EXAM: CHEST 1 VIEW COMPARISON:  Earlier today FINDINGS: Stable positioning of small bore chest tube at the left base. Elevated left diaphragm. No visible residual pneumothorax. Stable soft tissue emphysema in the left supraclavicular fossa. Normal heart size. Clear right lung. IMPRESSION: 1. No visible residual pneumothorax. Stable soft tissue emphysema in the left supraclavicular fossa. 2. Improving atelectasis at the left base.  Elevated left diaphragm. Electronically Signed   By: Marnee Spring M.D.   On: 09/12/2017 15:07   Dg Chest 2 View  Result Date: 09/12/2017 CLINICAL DATA:  Follow-up of left pneumothorax. Shoulder surgery yesterday. Chest tube. EXAM: CHEST  2 VIEW COMPARISON:  Yesterday FINDINGS: Both lateral views are suboptimal secondary to patient arm position. Midline trachea. Mild cardiomegaly. Atherosclerosis in the transverse aorta. Small bilateral pleural effusions. Placement of a small bore left-sided chest tube. Significant improvement in left-sided pneumothorax. Residual approximately 10% apical component. Mild bibasilar atelectasis. IMPRESSION: Placement of left-sided small bore chest tube/drainage  catheter. Significant improvement in approximately 10% left apical pneumothorax. Small bilateral pleural effusions with mild bibasilar atelectasis. Aortic Atherosclerosis (ICD10-I70.0). Electronically Signed   By: Jeronimo Greaves M.D.   On: 09/12/2017 07:39   Dg Chest Port 1 View  Result Date: 09/14/2017 CLINICAL DATA:  Follow-up left pneumothorax EXAM: PORTABLE CHEST 1 VIEW COMPARISON:  09/13/2017 FINDINGS: Left chest tube remains in place. Small left apical pneumothorax is stable. Left base atelectasis and elevation of the left hemidiaphragm, slightly improved. Right lung clear. Heart is normal size. IMPRESSION: Tiny residual left apical pneumothorax, stable. Improving left base atelectasis. Electronically Signed   By: Charlett Nose M.D.   On: 09/14/2017 09:17   Dg Chest Port 1 View  Result Date: 09/13/2017 CLINICAL DATA:  77 year old with history of left pneumothorax and chest tube placement. EXAM: PORTABLE CHEST 1 VIEW COMPARISON:  09/12/2017 FINDINGS: Again noted is elevation of the left hemidiaphragm. Increased volume loss at the left chest base. Pigtail catheter is coiled at the left chest base. There is not a definite pneumothorax. Few linear densities near the left lung apex are probably related to a rib. There is atelectasis in the right lower chest. Trachea is midline. Atherosclerotic calcifications at the aortic arch. Heart size is normal. Left shoulder arthroplasty. IMPRESSION: Stable position of the left chest tube without a definite pneumothorax. Increased atelectasis in both lower lobes. Electronically Signed   By: Richarda Overlie M.D.   On: 09/13/2017 09:47   Dg Chest Port 1 View  Result Date: 09/11/2017 CLINICAL DATA:  77 y/o M; status post shoulder replacement, evaluate for pneumothorax. EXAM: PORTABLE CHEST 1 VIEW COMPARISON:  None. FINDINGS: Normal size cardiac silhouette. Aortic atherosclerosis with calcification. Large left pneumothorax and partial collapse of the left lung. Elevated left  hemidiaphragm. Clear right lung. Postsurgical changes related to left proximal humerus replacement partially visualized with air in soft tissues and skin staples. IMPRESSION: Large left pneumothorax, partial collapse of left lung, elevated left hemidiaphragm. Critical Value/emergent results were called by telephone at the time of interpretation on 09/11/2017 at 2:13 pm to Dr. Yves Dill , who verbally acknowledged these results. Electronically Signed   By: Mitzi Hansen M.D.   On: 09/11/2017 14:22   Dg Shoulder Left Port  Result Date: 09/11/2017 CLINICAL DATA:  Postop day 0 left total shoulder joint replacement. EXAM: LEFT SHOULDER - 1 VIEW COMPARISON:  None in PACs FINDINGS: There is an abnormal appearance of the left lung with a pleural line and some consolidated lung centrally worrisome for an approximately 60% pneumothorax. The patient has undergone left total shoulder joint prosthesis placement. Radiographic positioning of the prosthetic components is good. The interface with the native bone appears normal. No acute native bone fracture is observed. There are small amounts of subcutaneous gas present. IMPRESSION: Proximally 60% left-sided pneumothorax. No immediate complication involving the left shoulder joint replacement per se. The report was called by me to Eve, R.N. in PACU at 1:53 p.m. on September 11, 2017. Electronically Signed   By: David  [place] M.D.   On: 09/11/2017 13:56   Ct Image Guided Drainage By Percutaneous Catheter  Result Date: 09/12/2017 CLINICAL DATA:  Left pneumothorax following left shoulder arthroplasty. EXAM: CT GUIDED LEFT CHEST THORACOSTOMY TUBE PLACEMENT ANESTHESIA/SEDATION: 2.0 mg IV Versed 50 mcg IV Fentanyl Total Moderate Sedation Time:  21 minutes The patient's level of consciousness and physiologic status were continuously monitored during the procedure by Radiology nursing. PROCEDURE: The procedure, risks, benefits, and alternatives were explained to the  patient. Questions regarding the procedure were encouraged and answered. The patient understands and consents to the procedure. A time out was performed prior to initiating the procedure. CT of the chest was performed in a supine position. The left anterior chest wall was prepped with chlorhexidine in a sterile fashion, and a sterile drape was applied covering the operative field. A sterile gown and sterile gloves were used for the procedure. Local anesthesia was provided with 1% Lidocaine. Under CT guidance, an 18 gauge trocar needle was advanced into the anterior left pleural space. After confirming needle tip position, a guidewire was advanced. The tract was dilated and a 12 French pigtail drainage catheter advanced over the wire. Catheter position was confirmed by CT. The catheter was connected to a Pleur-evac device. The catheter was then secured at the skin with a Prolene retention suture, StatLock device  and Vaseline gauze dressing. COMPLICATIONS: None FINDINGS: Initial CT demonstrates a large left pneumothorax with near complete collapse of the left lung. The thoracostomy tube was advanced into the anterior pleural space. IMPRESSION: Placement of 12 French left-sided pigtail thoracostomy tube to treat a large left pneumothorax. The tube was attached to a Pleur-evac device which will be attached to wall suction at -20 cm of water. Electronically Signed   By: Irish LackGlenn  Yamagata M.D.   On: 09/12/2017 09:30    Disposition: Final discharge disposition not confirmed       Signed: Lyndle HerrlichJames R Velora Mcintosh ,MD 09/14/2017, 4:12 PM

## 2017-09-14 NOTE — Progress Notes (Signed)
Pt discharged home with daughter and wanted to walk out with her. Pt informed of discharge instructions and voiced understanding of the discharge instructions. Pt's belongings sent with patient. IV taken out prior to discharge. Pt voiced no other complaints.

## 2017-09-14 NOTE — Progress Notes (Signed)
Occupational Therapy Treatment Patient Details Name: Joel Mcintosh MRN: [id number] DOB: 02/17/1941 Today's Date: 09/14/2017    History of present illness 77 y.o. male pt who underwent a elective procedure today for L total arthroplasty on 09/11/17.  When he came through from anesthesia he complained of some chest pain and shortness of breath.  Chest x-ray showed a large left pneumothorax, chest tube placed.     OT comments  Completed UB and LB dressing skills sitting EOB and in bathroom for pants.  Verbal cues and education provided to pt and his daughter Wille Celeste about precautions since pt was trying to flex L arm up to hold onto towel rack when putting on pants vs sitting down which would be safer. Assist for socks and tying shoes only.  Reviewed and completed exercises as ordered by MD.  Pt had chest tube removed today and spoke to Tamkea from [facility] about any precautions and there weren't any verbally from her or listed in chart.  Pt anxious to get dressed and go home today. Educated pt and his daughter Wille Celeste about how to position sling and how to adjust.  Pt to follow up with outpatient OT at [facility] in [place] after discharged today.  Follow Up Recommendations  DC plan and follow up therapy as arranged by surgeon    Equipment Recommendations  None recommended by OT    Recommendations for Other Services      Precautions / Restrictions Precautions Precautions: Shoulder Shoulder Interventions: Shoulder sling/immobilizer;Off for dressing/bathing/exercises;At all times Restrictions Weight Bearing Restrictions: Yes LUE Weight Bearing: Non weight bearing Other Position/Activity Restrictions: No A/PROM L shoulder, AROM elbow/wrist/hand to tolerate, LUE NWBing, sling on at all times except dressing/bathing/exercises       Mobility Bed Mobility                  Transfers                      Balance                                           ADL  either performed or assessed with clinical judgement   ADL Overall ADL's : Needs assistance/impaired                                       General ADL Comments: Completed UB and LB dressing skills sitting EOB and in bathroom for pants.  Verbal cues and education provided to pt and his daughter Wille Celeste about precautions since pt was trying to flex L arm up to hold onto towel rack when putting on pants vs sitting down which would be safer. Assist for socks and tying shoes only.  Reviewed and completed exercises as ordered by MD.  Pt had chest tube removed today and spoke to Tamkea from [facility] about any precautions and there weren't any verbally from her or listed in chart.  Pt anxious to get dressed and go home today.     Vision Baseline Vision/History: Wears glasses Wears Glasses: At all times Patient Visual Report: No change from baseline     Perception     Praxis      Cognition Arousal/Alertness: Awake/alert Behavior During Therapy: WFL for tasks assessed/performed;Impulsive Overall Cognitive Status: Within Functional Limits for  tasks assessed                                          Exercises     Shoulder Instructions       General Comments      Pertinent Vitals/ Pain       Pain Assessment: 0-10 Pain Score: 2  Pain Location: L shoulder with movement Pain Descriptors / Indicators: Aching Pain Intervention(s): Limited activity within patient's tolerance;Monitored during session;Premedicated before session;Repositioned  Home Living                                          Prior Functioning/Environment              Frequency  Min 1X/week        Progress Toward Goals  OT Goals(current goals can now be found in the care plan section)  Progress towards OT goals: Progressing toward goals  Acute Rehab OT Goals Patient Stated Goal: go home and recover OT Goal Formulation: With patient/family Time For Goal  Achievement: 09/26/17 Potential to Achieve Goals: Good  Plan Discharge plan remains appropriate;Frequency remains appropriate    Co-evaluation                 AM-PAC PT "6 Clicks" Daily Activity     Outcome Measure   Help from another person eating meals?: None Help from another person taking care of personal grooming?: None Help from another person toileting, which includes using toliet, bedpan, or urinal?: A Little Help from another person bathing (including washing, rinsing, drying)?: A Little Help from another person to put on and taking off regular upper body clothing?: A Little Help from another person to put on and taking off regular lower body clothing?: A Little 6 Click Score: 20    End of Session    OT Visit Diagnosis: Pain Pain - Right/Left: Left Pain - part of body: Shoulder   Activity Tolerance Patient tolerated treatment well   Patient Left in bed;with family/visitor present;with bed alarm set   Nurse Communication Precautions        Time: 1520-1600 OT Time Calculation (min): 40 min  Charges: OT General Charges $OT Visit: 1 Visit OT Treatments $Self Care/Home Management : 8-22 mins $Therapeutic Activity: 23-37 mins  Susanne BordersSusan Wofford, OTR/L ascom [phone] 09/14/17, 4:10 PM

## 2017-09-18 ENCOUNTER — Other Ambulatory Visit: Payer: Self-pay

## 2017-09-18 DIAGNOSIS — M6281 Muscle weakness (generalized): Secondary | ICD-10-CM | POA: Diagnosis not present

## 2017-09-18 DIAGNOSIS — M25612 Stiffness of left shoulder, not elsewhere classified: Secondary | ICD-10-CM | POA: Diagnosis not present

## 2017-09-18 DIAGNOSIS — M25512 Pain in left shoulder: Secondary | ICD-10-CM | POA: Diagnosis not present

## 2017-09-20 DIAGNOSIS — M25512 Pain in left shoulder: Secondary | ICD-10-CM | POA: Diagnosis not present

## 2017-09-20 DIAGNOSIS — M25612 Stiffness of left shoulder, not elsewhere classified: Secondary | ICD-10-CM | POA: Diagnosis not present

## 2017-09-20 DIAGNOSIS — M6281 Muscle weakness (generalized): Secondary | ICD-10-CM | POA: Diagnosis not present

## 2017-09-22 ENCOUNTER — Ambulatory Visit
Admission: RE | Admit: 2017-09-22 | Discharge: 2017-09-22 | Disposition: A | Discharge status: Home / Self Care | Payer: Medicare Other | Source: Ambulatory Visit | Attending: Cardiothoracic Surgery | Admitting: Cardiothoracic Surgery

## 2017-09-22 ENCOUNTER — Encounter: Payer: Self-pay | Admitting: Cardiothoracic Surgery

## 2017-09-22 ENCOUNTER — Ambulatory Visit (INDEPENDENT_AMBULATORY_CARE_PROVIDER_SITE_OTHER): Payer: Medicare Other | Admitting: Cardiothoracic Surgery

## 2017-09-22 VITALS — BP 154/66 | HR 68 | Temp 97.4°F | Ht 70.0 in | Wt 172.0 lb

## 2017-09-22 DIAGNOSIS — J939 Pneumothorax, unspecified: Secondary | ICD-10-CM | POA: Diagnosis not present

## 2017-09-22 NOTE — Progress Notes (Signed)
  Patient ID: Joel Mcintosh, male   DOB: Jun 20, 1941, 77 y.o.   MRN: [id number]  HISTORY: He returns today in follow-up.  He did suffer an iatrogenic pneumothorax and had a chest tube placed for a short period of time.  It has been removed now.  He does not complain of any significant shortness of breath.  He did have his sutures removed from his left arm.  He is able to use that now.  He has no other complaints.   Vitals:   09/22/17 1009  BP: (!) 154/66  Pulse: 68  Temp: (!) 97.4 F (36.3 C)  SpO2: 97%     EXAM:    Resp: Lungs are clear bilaterally.  No respiratory distress, normal effort. Heart:  Regular without murmurs Abd:  Abdomen is soft, non distended and non tender. No masses are palpable.  There is no rebound and no guarding.  Neurological: Alert and oriented to person, place, and time. Coordination normal.  Skin: Skin is warm and dry. No rash noted. No diaphoretic. No erythema. No pallor. The chest tube site looks clean.  There is no erythema or drainage. Psychiatric: Normal mood and affect. Normal behavior. Judgment and thought content normal.    ASSESSMENT: He did have a chest x-ray today which have independently reviewed.  There is no sign of pneumothorax or pleural effusion.   PLAN:   I did not make a return appointment for him but would be happy to see him should the need arise.    Hulda Marinimothy Yarel Rushlow, MD

## 2017-09-25 DIAGNOSIS — M6281 Muscle weakness (generalized): Secondary | ICD-10-CM | POA: Diagnosis not present

## 2017-09-25 DIAGNOSIS — M25512 Pain in left shoulder: Secondary | ICD-10-CM | POA: Diagnosis not present

## 2017-09-25 DIAGNOSIS — M25612 Stiffness of left shoulder, not elsewhere classified: Secondary | ICD-10-CM | POA: Diagnosis not present

## 2017-09-27 DIAGNOSIS — M25512 Pain in left shoulder: Secondary | ICD-10-CM | POA: Diagnosis not present

## 2017-09-27 DIAGNOSIS — M6281 Muscle weakness (generalized): Secondary | ICD-10-CM | POA: Diagnosis not present

## 2017-09-27 DIAGNOSIS — M25612 Stiffness of left shoulder, not elsewhere classified: Secondary | ICD-10-CM | POA: Diagnosis not present

## 2017-10-02 DIAGNOSIS — M25512 Pain in left shoulder: Secondary | ICD-10-CM | POA: Diagnosis not present

## 2017-10-02 DIAGNOSIS — M6281 Muscle weakness (generalized): Secondary | ICD-10-CM | POA: Diagnosis not present

## 2017-10-02 DIAGNOSIS — M25612 Stiffness of left shoulder, not elsewhere classified: Secondary | ICD-10-CM | POA: Diagnosis not present

## 2017-10-04 DIAGNOSIS — M25512 Pain in left shoulder: Secondary | ICD-10-CM | POA: Diagnosis not present

## 2017-10-04 DIAGNOSIS — M25612 Stiffness of left shoulder, not elsewhere classified: Secondary | ICD-10-CM | POA: Diagnosis not present

## 2017-10-04 DIAGNOSIS — M6281 Muscle weakness (generalized): Secondary | ICD-10-CM | POA: Diagnosis not present

## 2017-10-09 DIAGNOSIS — M6281 Muscle weakness (generalized): Secondary | ICD-10-CM | POA: Diagnosis not present

## 2017-10-09 DIAGNOSIS — M25612 Stiffness of left shoulder, not elsewhere classified: Secondary | ICD-10-CM | POA: Diagnosis not present

## 2017-10-09 DIAGNOSIS — M25512 Pain in left shoulder: Secondary | ICD-10-CM | POA: Diagnosis not present

## 2017-10-11 DIAGNOSIS — M6281 Muscle weakness (generalized): Secondary | ICD-10-CM | POA: Diagnosis not present

## 2017-10-11 DIAGNOSIS — M25512 Pain in left shoulder: Secondary | ICD-10-CM | POA: Diagnosis not present

## 2017-10-11 DIAGNOSIS — M25612 Stiffness of left shoulder, not elsewhere classified: Secondary | ICD-10-CM | POA: Diagnosis not present

## 2017-10-16 DIAGNOSIS — M25612 Stiffness of left shoulder, not elsewhere classified: Secondary | ICD-10-CM | POA: Diagnosis not present

## 2017-10-16 DIAGNOSIS — M25512 Pain in left shoulder: Secondary | ICD-10-CM | POA: Diagnosis not present

## 2017-10-16 DIAGNOSIS — M6281 Muscle weakness (generalized): Secondary | ICD-10-CM | POA: Diagnosis not present

## 2017-10-18 DIAGNOSIS — M25512 Pain in left shoulder: Secondary | ICD-10-CM | POA: Diagnosis not present

## 2017-10-18 DIAGNOSIS — M6281 Muscle weakness (generalized): Secondary | ICD-10-CM | POA: Diagnosis not present

## 2017-10-18 DIAGNOSIS — M25612 Stiffness of left shoulder, not elsewhere classified: Secondary | ICD-10-CM | POA: Diagnosis not present

## 2017-10-19 ENCOUNTER — Encounter: Payer: Self-pay | Admitting: Orthopedic Surgery

## 2017-10-19 DIAGNOSIS — G2581 Restless legs syndrome: Secondary | ICD-10-CM | POA: Diagnosis not present

## 2017-10-19 DIAGNOSIS — M19011 Primary osteoarthritis, right shoulder: Secondary | ICD-10-CM | POA: Diagnosis not present

## 2017-10-19 DIAGNOSIS — M19012 Primary osteoarthritis, left shoulder: Secondary | ICD-10-CM | POA: Diagnosis not present

## 2017-10-21 DIAGNOSIS — J9383 Other pneumothorax: Secondary | ICD-10-CM

## 2017-10-21 DIAGNOSIS — J95811 Postprocedural pneumothorax: Secondary | ICD-10-CM

## 2017-10-23 DIAGNOSIS — M25612 Stiffness of left shoulder, not elsewhere classified: Secondary | ICD-10-CM | POA: Diagnosis not present

## 2017-10-23 DIAGNOSIS — M6281 Muscle weakness (generalized): Secondary | ICD-10-CM | POA: Diagnosis not present

## 2017-10-23 DIAGNOSIS — M25512 Pain in left shoulder: Secondary | ICD-10-CM | POA: Diagnosis not present

## 2017-10-25 DIAGNOSIS — M6281 Muscle weakness (generalized): Secondary | ICD-10-CM | POA: Diagnosis not present

## 2017-10-25 DIAGNOSIS — M25612 Stiffness of left shoulder, not elsewhere classified: Secondary | ICD-10-CM | POA: Diagnosis not present

## 2017-10-25 DIAGNOSIS — M25512 Pain in left shoulder: Secondary | ICD-10-CM | POA: Diagnosis not present

## 2017-10-30 DIAGNOSIS — M25612 Stiffness of left shoulder, not elsewhere classified: Secondary | ICD-10-CM | POA: Diagnosis not present

## 2017-10-30 DIAGNOSIS — M6281 Muscle weakness (generalized): Secondary | ICD-10-CM | POA: Diagnosis not present

## 2017-10-30 DIAGNOSIS — M25512 Pain in left shoulder: Secondary | ICD-10-CM | POA: Diagnosis not present

## 2017-10-31 DIAGNOSIS — M79605 Pain in left leg: Secondary | ICD-10-CM | POA: Diagnosis not present

## 2017-10-31 DIAGNOSIS — M79604 Pain in right leg: Secondary | ICD-10-CM | POA: Diagnosis not present

## 2017-11-01 DIAGNOSIS — M25512 Pain in left shoulder: Secondary | ICD-10-CM | POA: Diagnosis not present

## 2017-11-01 DIAGNOSIS — M6281 Muscle weakness (generalized): Secondary | ICD-10-CM | POA: Diagnosis not present

## 2017-11-01 DIAGNOSIS — M25612 Stiffness of left shoulder, not elsewhere classified: Secondary | ICD-10-CM | POA: Diagnosis not present

## 2017-11-02 DIAGNOSIS — R739 Hyperglycemia, unspecified: Secondary | ICD-10-CM | POA: Diagnosis not present

## 2017-11-02 DIAGNOSIS — R7309 Other abnormal glucose: Secondary | ICD-10-CM | POA: Diagnosis not present

## 2017-11-02 DIAGNOSIS — Z6824 Body mass index (BMI) 24.0-24.9, adult: Secondary | ICD-10-CM | POA: Diagnosis not present

## 2017-11-02 DIAGNOSIS — N289 Disorder of kidney and ureter, unspecified: Secondary | ICD-10-CM | POA: Diagnosis not present

## 2017-11-02 DIAGNOSIS — G4762 Sleep related leg cramps: Secondary | ICD-10-CM | POA: Diagnosis not present

## 2017-11-02 DIAGNOSIS — I10 Essential (primary) hypertension: Secondary | ICD-10-CM | POA: Diagnosis not present

## 2017-11-02 DIAGNOSIS — G4733 Obstructive sleep apnea (adult) (pediatric): Secondary | ICD-10-CM | POA: Diagnosis not present

## 2017-11-02 DIAGNOSIS — Z125 Encounter for screening for malignant neoplasm of prostate: Secondary | ICD-10-CM | POA: Diagnosis not present

## 2017-11-02 DIAGNOSIS — E785 Hyperlipidemia, unspecified: Secondary | ICD-10-CM | POA: Diagnosis not present

## 2017-11-06 DIAGNOSIS — M25612 Stiffness of left shoulder, not elsewhere classified: Secondary | ICD-10-CM | POA: Diagnosis not present

## 2017-11-06 DIAGNOSIS — M25512 Pain in left shoulder: Secondary | ICD-10-CM | POA: Diagnosis not present

## 2017-11-06 DIAGNOSIS — M6281 Muscle weakness (generalized): Secondary | ICD-10-CM | POA: Diagnosis not present

## 2017-11-10 DIAGNOSIS — Z0000 Encounter for general adult medical examination without abnormal findings: Secondary | ICD-10-CM | POA: Diagnosis not present

## 2017-11-10 DIAGNOSIS — Z6825 Body mass index (BMI) 25.0-25.9, adult: Secondary | ICD-10-CM | POA: Diagnosis not present

## 2018-03-27 DIAGNOSIS — E7800 Pure hypercholesterolemia, unspecified: Secondary | ICD-10-CM | POA: Diagnosis not present

## 2018-03-27 DIAGNOSIS — E663 Overweight: Secondary | ICD-10-CM | POA: Diagnosis not present

## 2018-03-27 DIAGNOSIS — G4733 Obstructive sleep apnea (adult) (pediatric): Secondary | ICD-10-CM | POA: Diagnosis not present

## 2018-03-27 DIAGNOSIS — M545 Low back pain: Secondary | ICD-10-CM | POA: Diagnosis not present

## 2018-03-27 DIAGNOSIS — Z79899 Other long term (current) drug therapy: Secondary | ICD-10-CM | POA: Diagnosis not present

## 2018-03-27 DIAGNOSIS — I10 Essential (primary) hypertension: Secondary | ICD-10-CM | POA: Diagnosis not present

## 2018-06-06 DIAGNOSIS — Z23 Encounter for immunization: Secondary | ICD-10-CM | POA: Diagnosis not present

## 2018-06-06 IMAGING — DX DG SHOULDER 1V*L*
2 series · 2 of 2 positions shown · non-contrast
Comparison: None in PACs

CLINICAL DATA: Postop day 0 left total shoulder joint replacement.

EXAM:
LEFT SHOULDER - 1 VIEW

[shoulder ap]
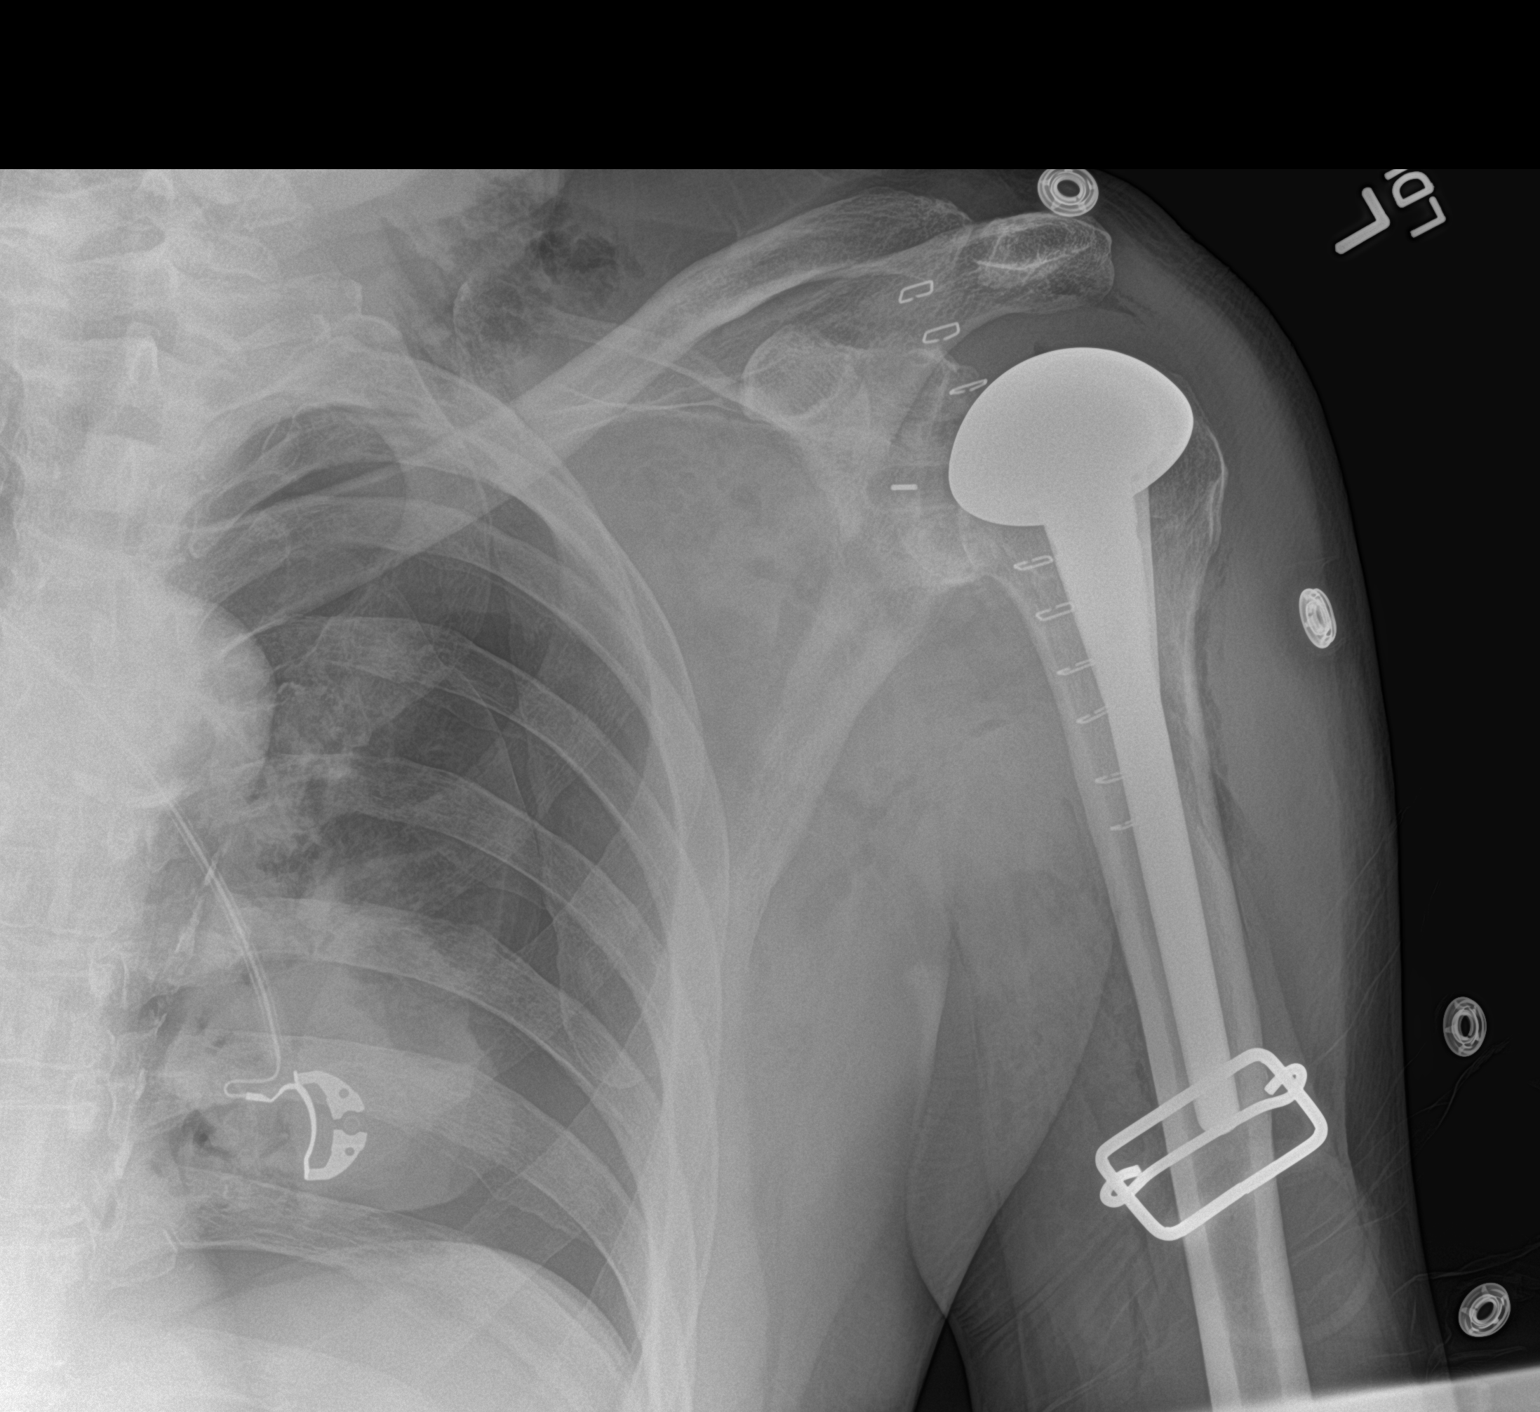

[shoulder axial]
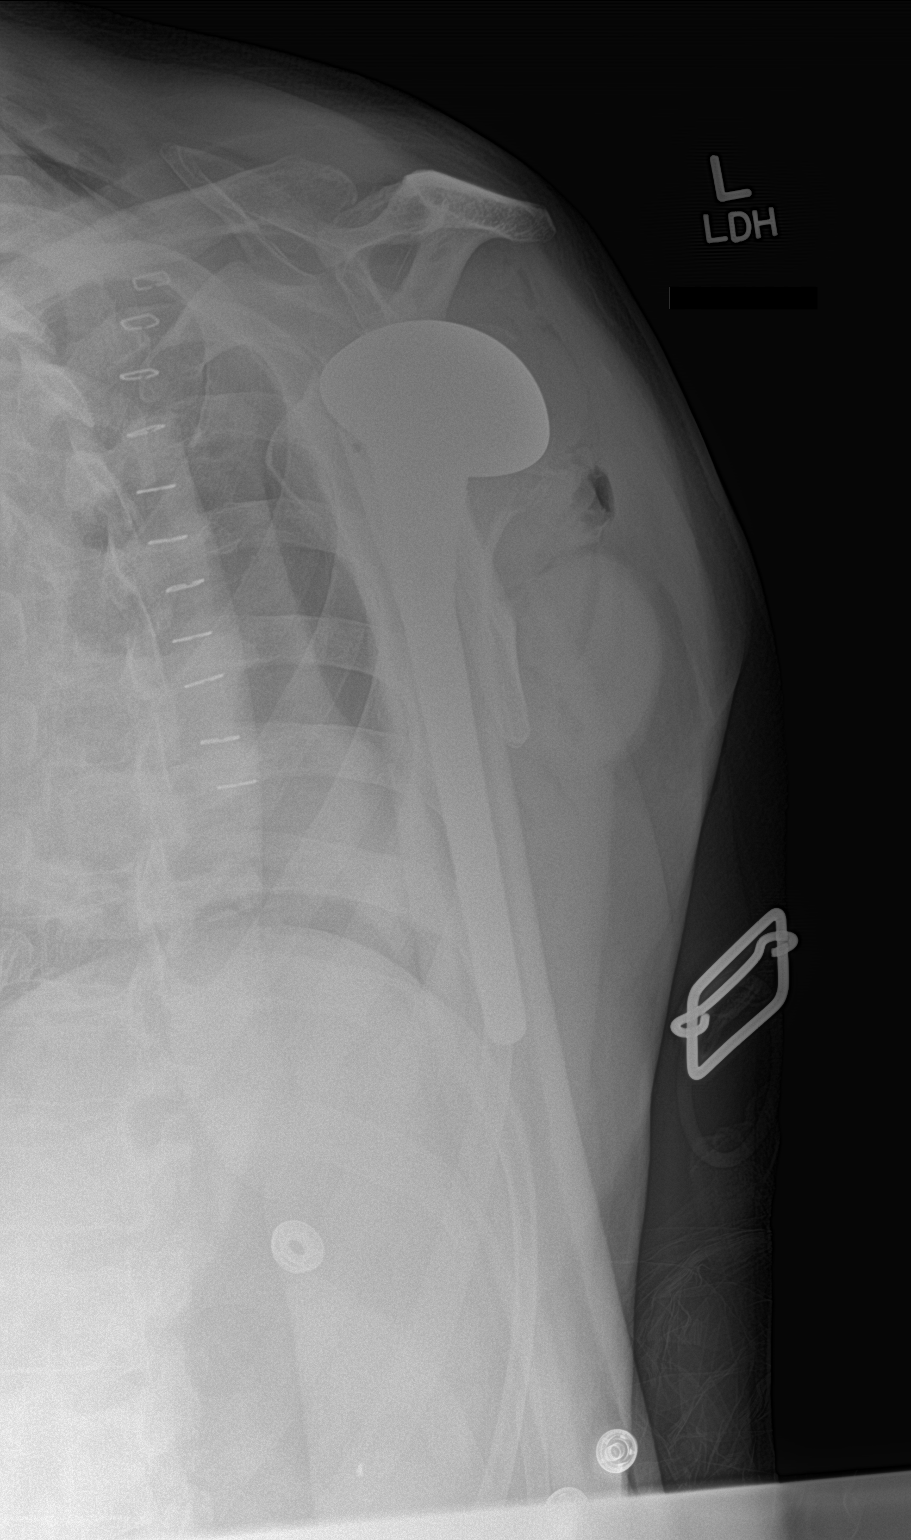

[2 of 2 positions shown; findings below may reference images not displayed]

FINDINGS: There is an abnormal appearance of the left lung with a pleural line
and some consolidated lung centrally worrisome for an approximately
60% pneumothorax. The patient has undergone left total shoulder
joint prosthesis placement. Radiographic positioning of the
prosthetic components is good. The interface with the native bone
appears normal. No acute native bone fracture is observed. There are
small amounts of subcutaneous gas present.
IMPRESSION: Proximally 60% left-sided pneumothorax.

No immediate complication involving the left shoulder joint
replacement per se.

The report was called by me to Amundson, Bhavin in PACU at [DATE] p.m. on
September 11, 2017.

## 2018-06-07 IMAGING — CR DG CHEST 2V
1 series · 3 of 3 positions shown · non-contrast
Comparison: Yesterday

CLINICAL DATA: Follow-up of left pneumothorax. Shoulder surgery
yesterday. Chest tube.

EXAM:
CHEST  2 VIEW

[Series 1: dg chest 2 view · 0.14mm/px · 3 of 3 slices shown]
[im 1/3]
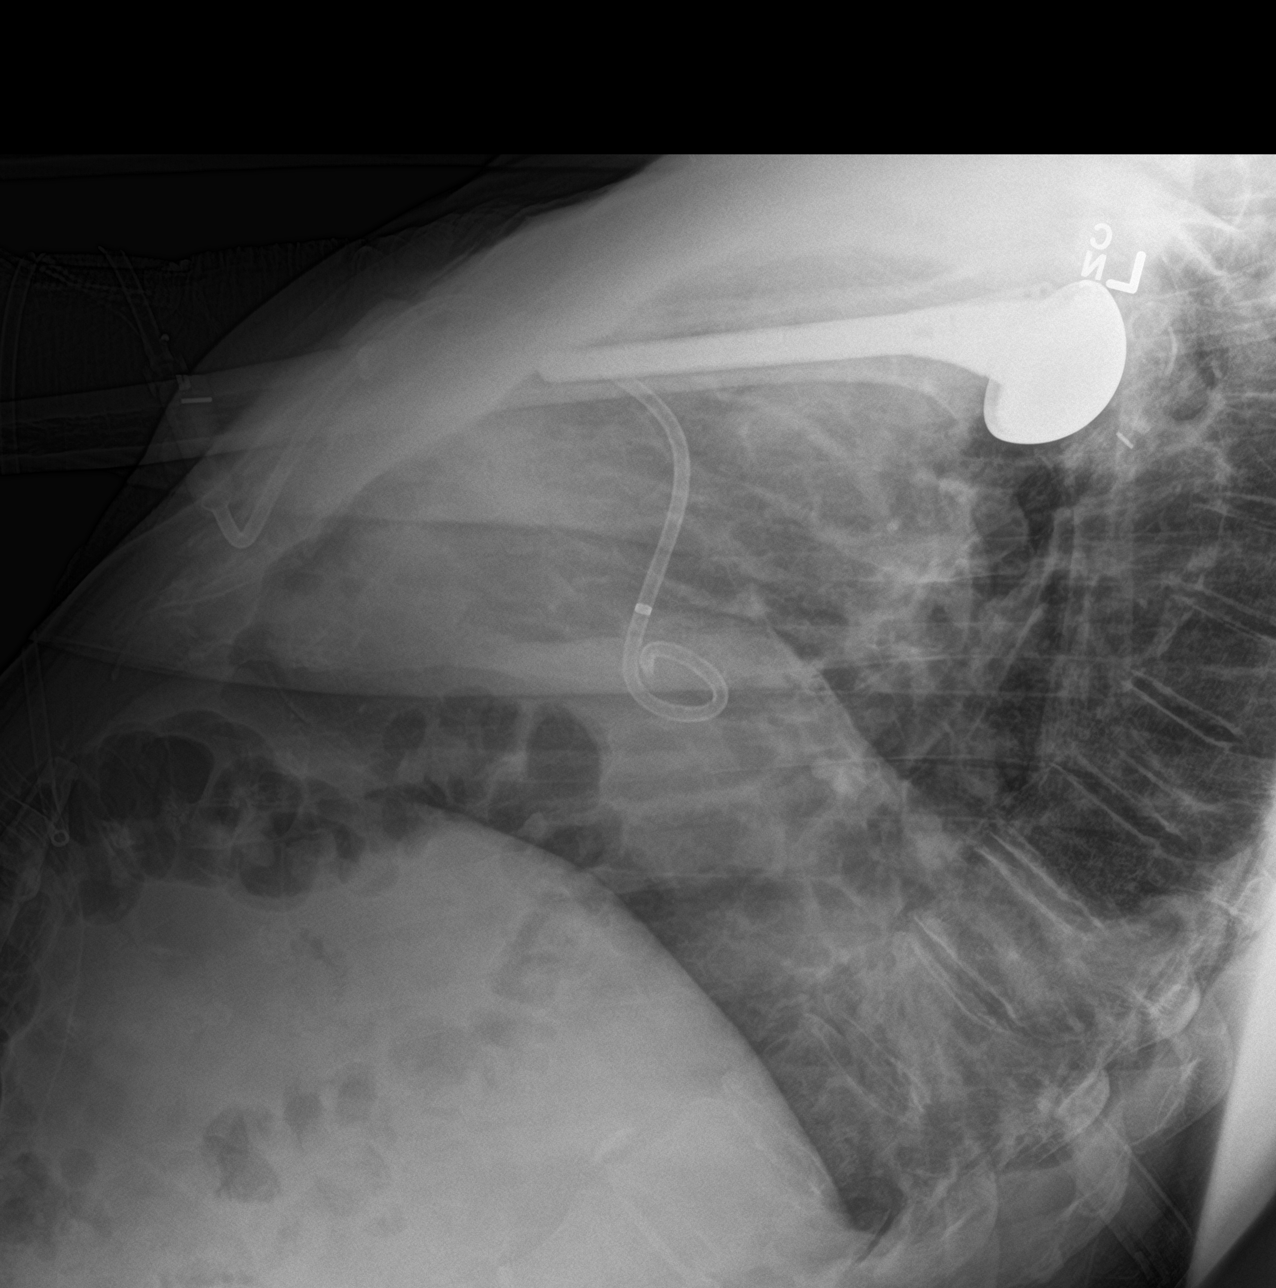
[im 2/3]
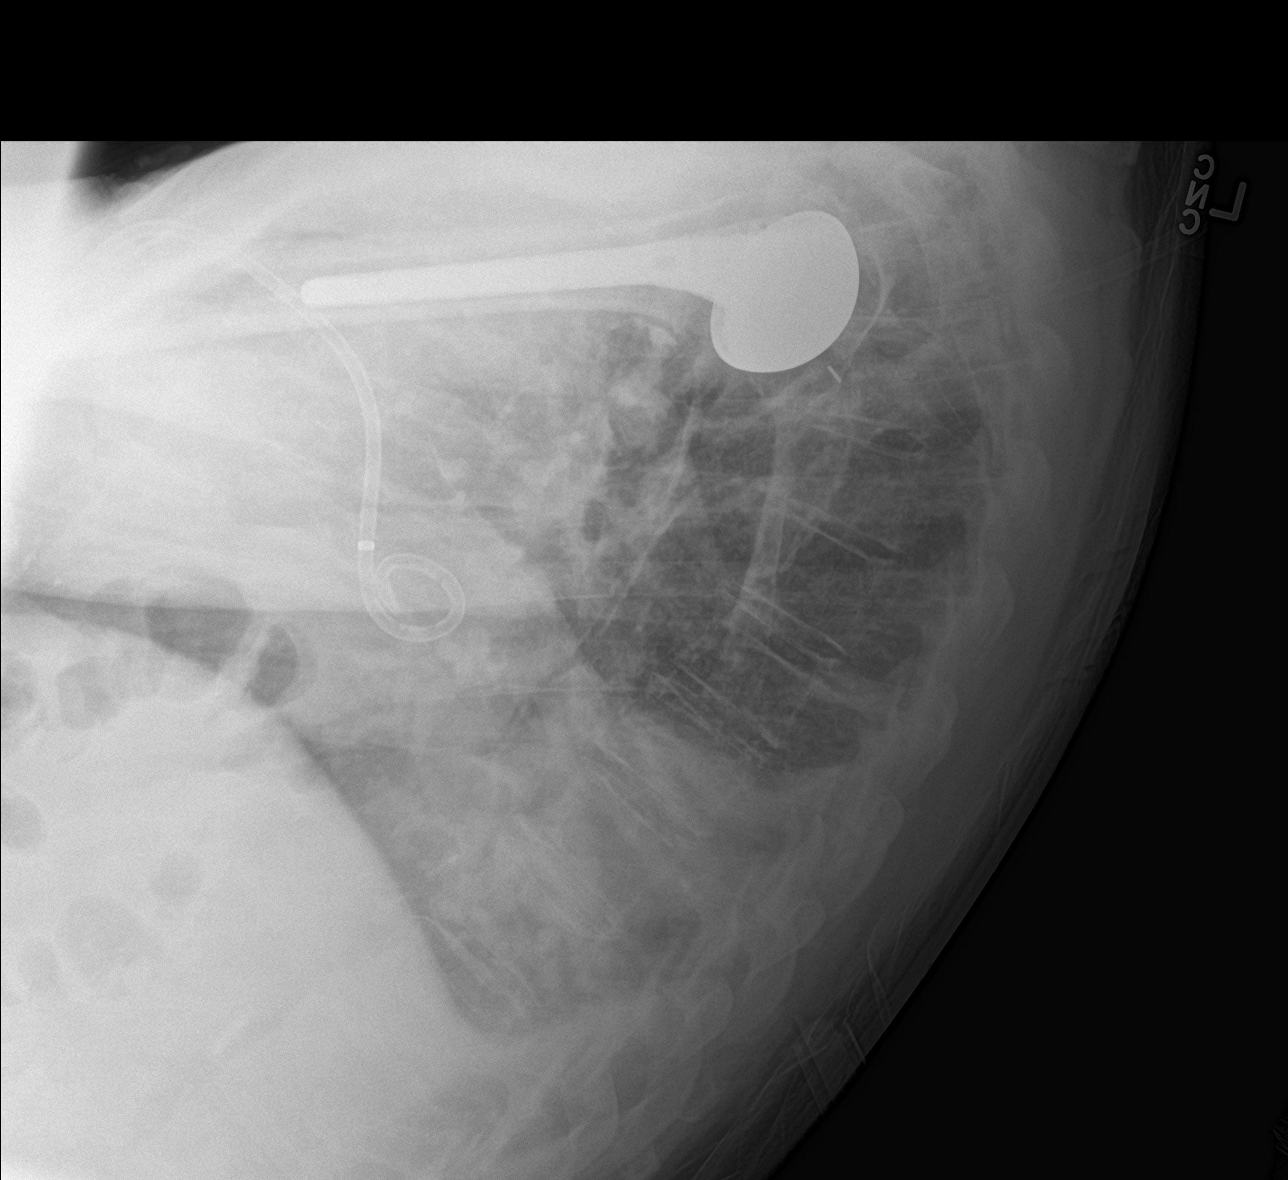
[im 3/3]
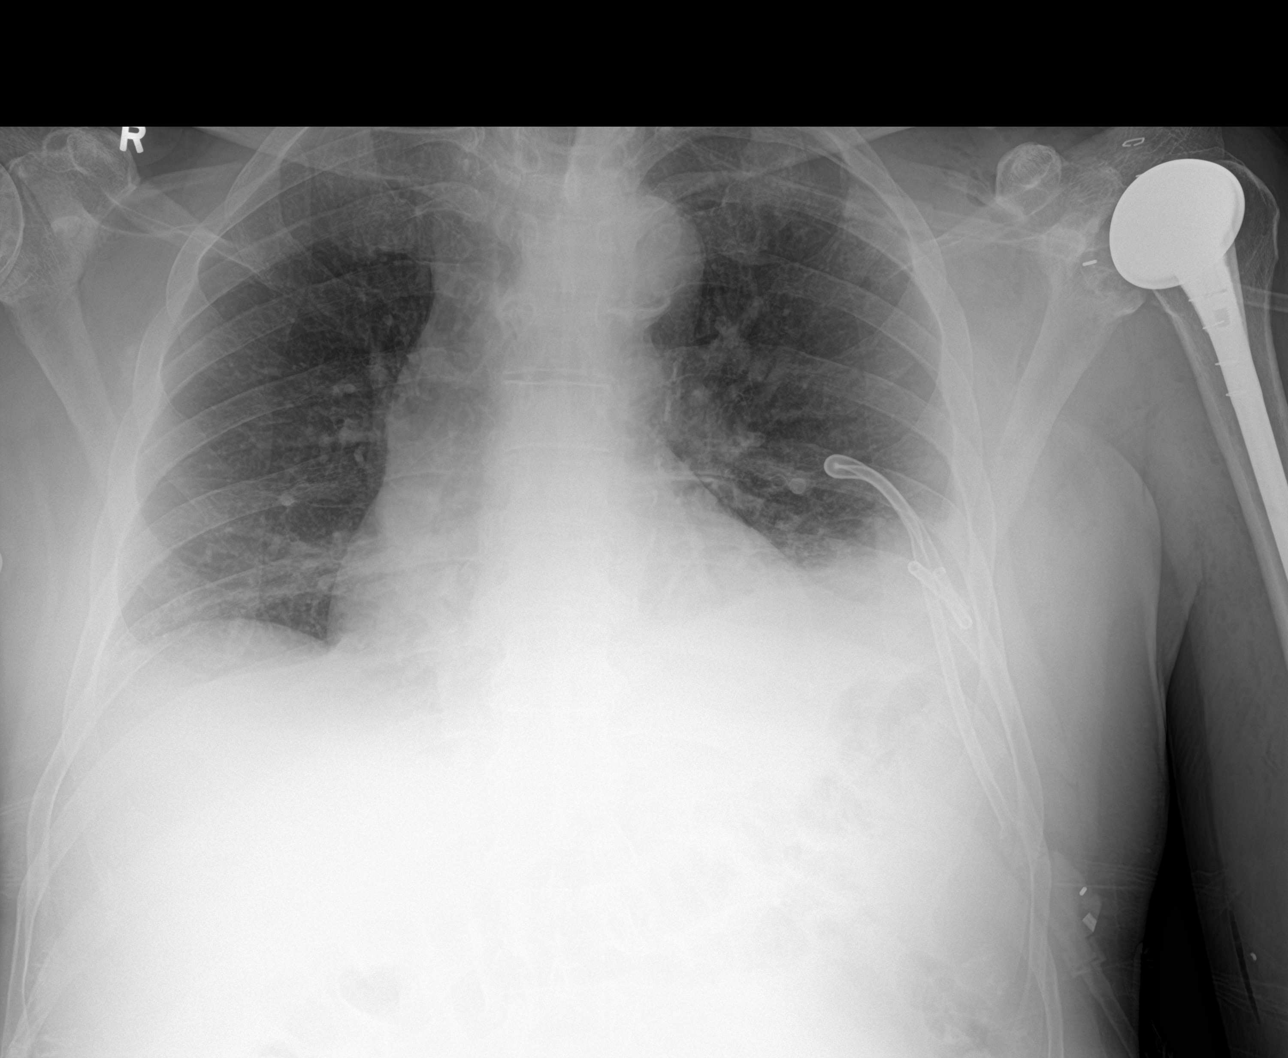

[3 of 3 positions shown; findings below may reference images not displayed]

FINDINGS: Both lateral views are suboptimal secondary to patient arm position.
Midline trachea. Mild cardiomegaly. Atherosclerosis in the
transverse aorta. Small bilateral pleural effusions. Placement of a
small bore left-sided chest tube. Significant improvement in
left-sided pneumothorax. Residual approximately 10% apical
component.

Mild bibasilar atelectasis.
IMPRESSION: Placement of left-sided small bore chest tube/drainage catheter.
Significant improvement in approximately 10% left apical
pneumothorax.

Small bilateral pleural effusions with mild bibasilar atelectasis.

Aortic Atherosclerosis ([id number]).

## 2018-06-17 IMAGING — CR DG CHEST 2V
2 series · 2 of 2 positions shown · non-contrast
Comparison: 09/14/2017

CLINICAL DATA: Left shoulder surgery with subsequent pneumothorax.

EXAM:
CHEST  2 VIEW

[chest pa]
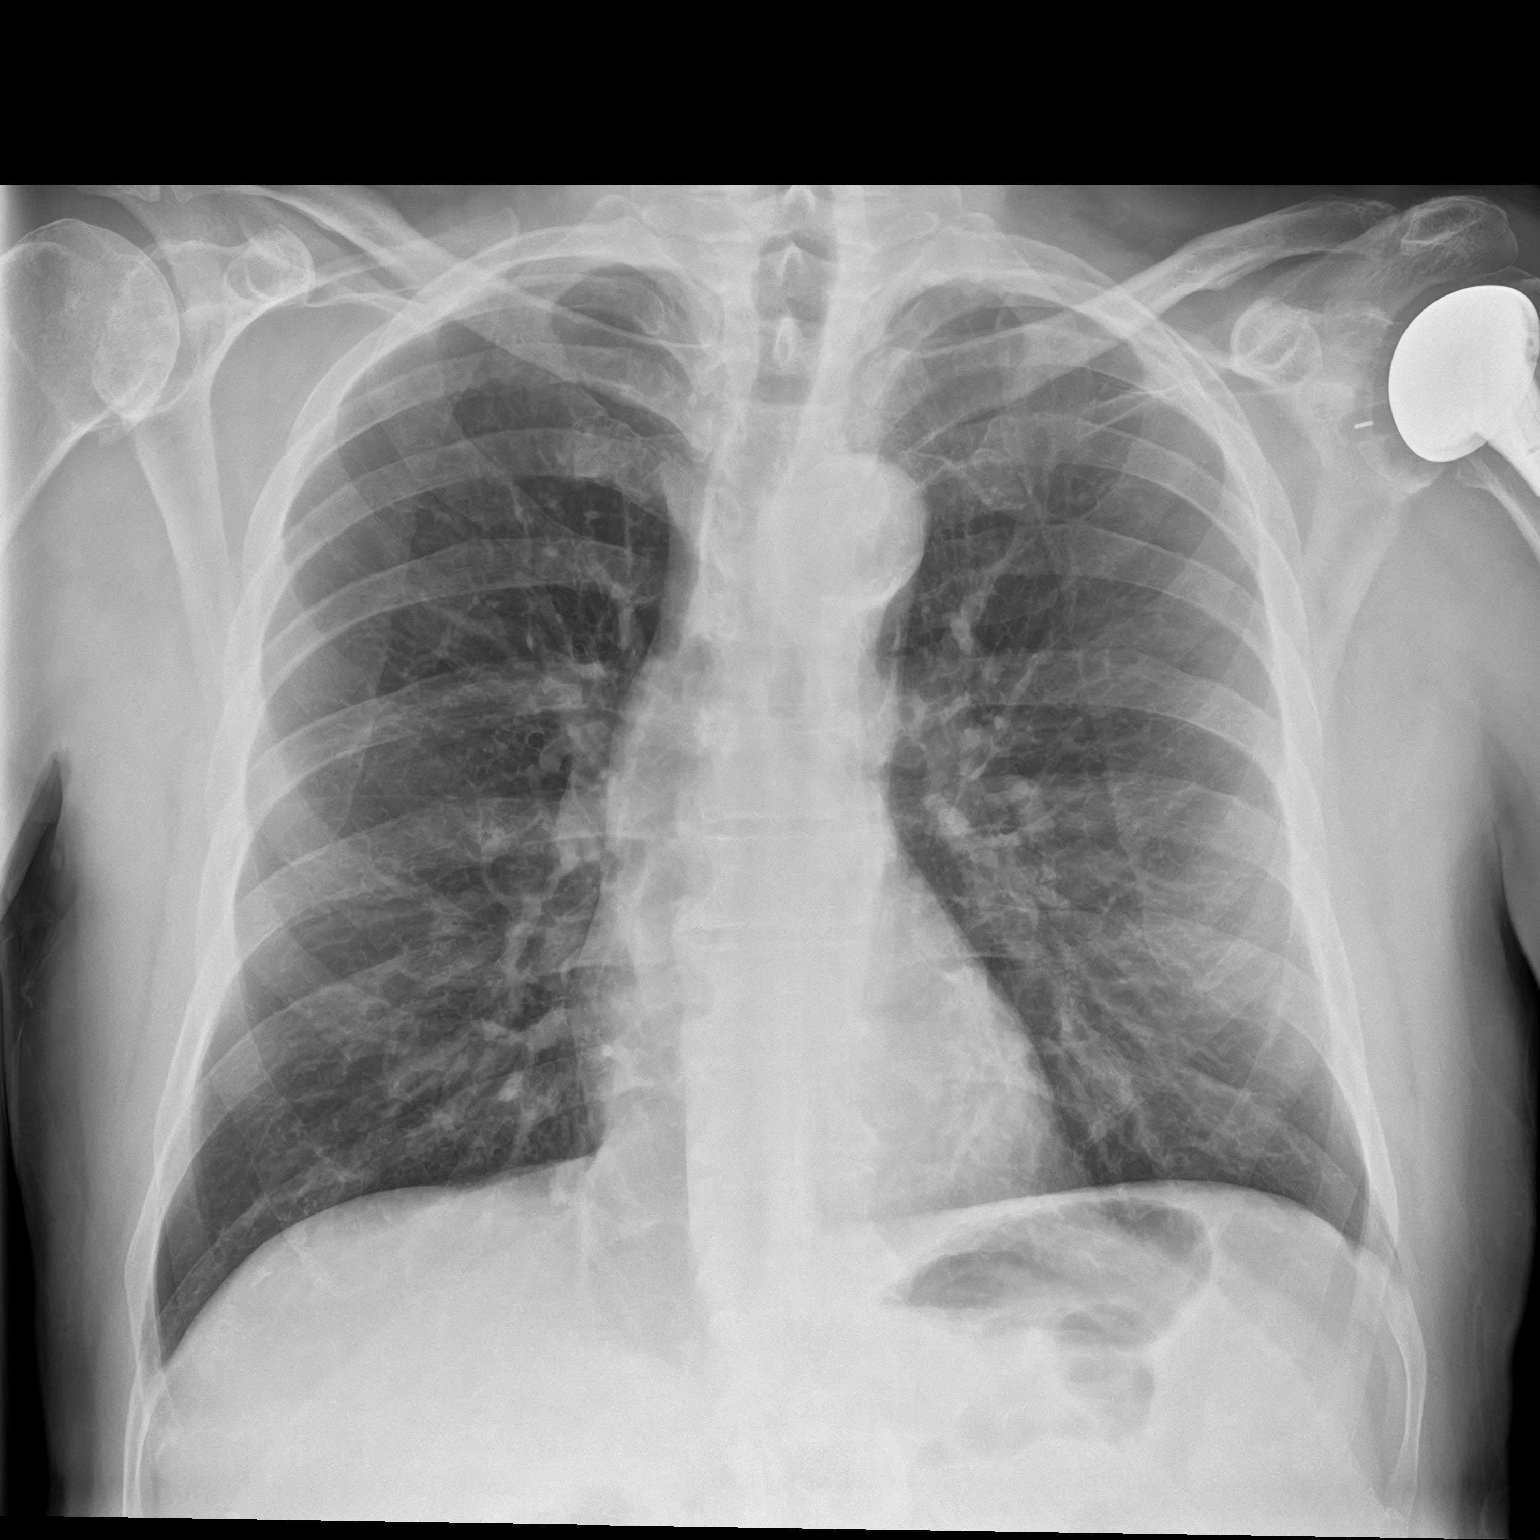

[chest lat]
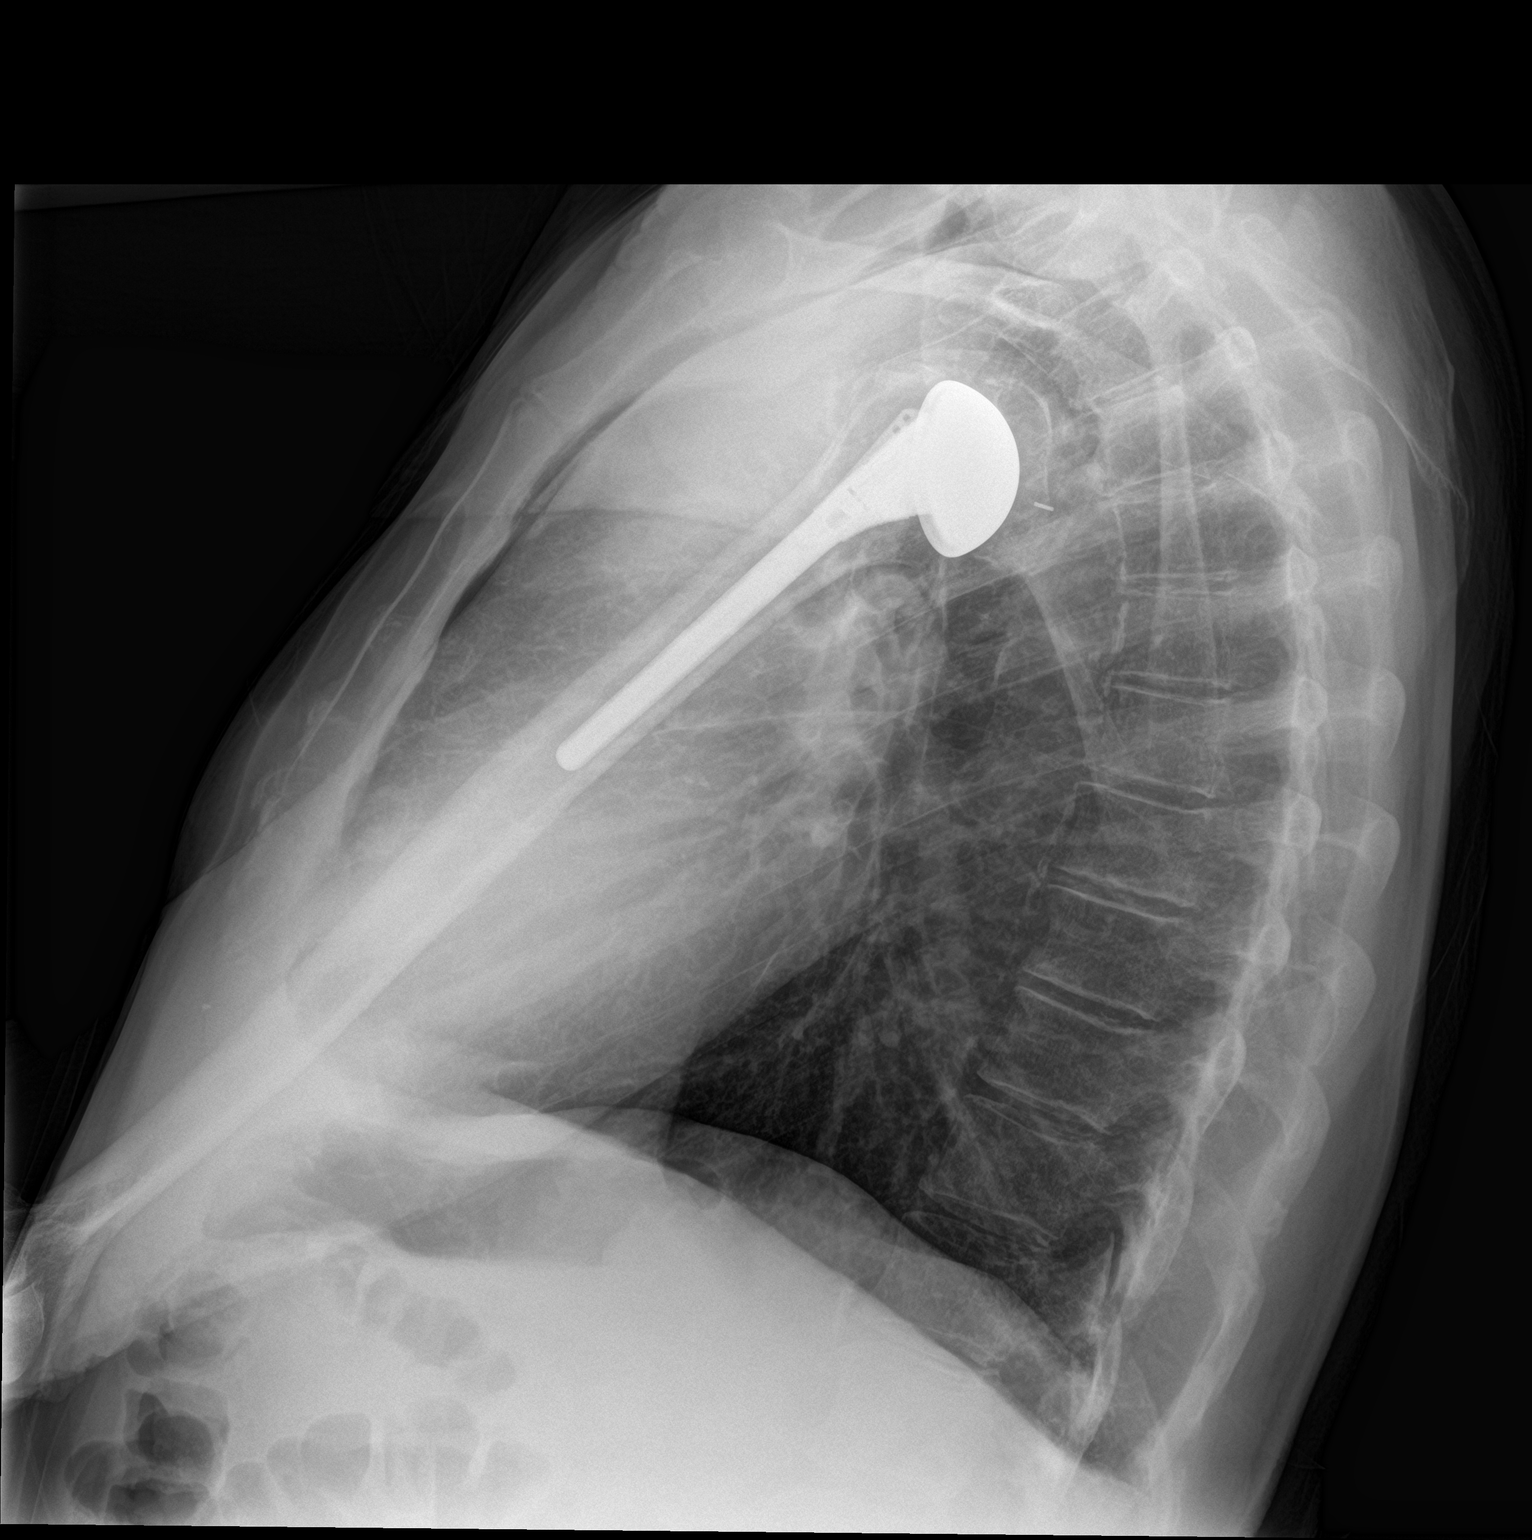

[2 of 2 positions shown; findings below may reference images not displayed]

FINDINGS: Interval removal of left chest tube. No pneumothorax. Heart is
normal size. Lungs clear. No effusions or acute bony abnormality.
IMPRESSION: No visible pneumothorax.

## 2018-06-27 DIAGNOSIS — E663 Overweight: Secondary | ICD-10-CM | POA: Diagnosis not present

## 2018-06-27 DIAGNOSIS — Z1331 Encounter for screening for depression: Secondary | ICD-10-CM | POA: Diagnosis not present

## 2018-06-27 DIAGNOSIS — G4733 Obstructive sleep apnea (adult) (pediatric): Secondary | ICD-10-CM | POA: Diagnosis not present

## 2018-06-27 DIAGNOSIS — E7800 Pure hypercholesterolemia, unspecified: Secondary | ICD-10-CM | POA: Diagnosis not present

## 2018-06-27 DIAGNOSIS — Z1339 Encounter for screening examination for other mental health and behavioral disorders: Secondary | ICD-10-CM | POA: Diagnosis not present

## 2018-06-27 DIAGNOSIS — M109 Gout, unspecified: Secondary | ICD-10-CM | POA: Diagnosis not present

## 2018-06-27 DIAGNOSIS — D649 Anemia, unspecified: Secondary | ICD-10-CM | POA: Diagnosis not present

## 2018-06-27 DIAGNOSIS — I10 Essential (primary) hypertension: Secondary | ICD-10-CM | POA: Diagnosis not present

## 2018-08-27 DIAGNOSIS — I10 Essential (primary) hypertension: Secondary | ICD-10-CM | POA: Diagnosis not present

## 2018-09-03 DIAGNOSIS — I10 Essential (primary) hypertension: Secondary | ICD-10-CM | POA: Diagnosis not present

## 2018-09-03 DIAGNOSIS — Z6826 Body mass index (BMI) 26.0-26.9, adult: Secondary | ICD-10-CM | POA: Diagnosis not present

## 2018-12-26 DIAGNOSIS — Z9181 History of falling: Secondary | ICD-10-CM | POA: Diagnosis not present

## 2018-12-26 DIAGNOSIS — E7800 Pure hypercholesterolemia, unspecified: Secondary | ICD-10-CM | POA: Diagnosis not present

## 2018-12-26 DIAGNOSIS — M109 Gout, unspecified: Secondary | ICD-10-CM | POA: Diagnosis not present

## 2018-12-26 DIAGNOSIS — I10 Essential (primary) hypertension: Secondary | ICD-10-CM | POA: Diagnosis not present

## 2018-12-26 DIAGNOSIS — E876 Hypokalemia: Secondary | ICD-10-CM | POA: Diagnosis not present

## 2019-03-07 DIAGNOSIS — E785 Hyperlipidemia, unspecified: Secondary | ICD-10-CM | POA: Diagnosis not present

## 2019-03-07 DIAGNOSIS — Z9181 History of falling: Secondary | ICD-10-CM | POA: Diagnosis not present

## 2019-03-07 DIAGNOSIS — Z125 Encounter for screening for malignant neoplasm of prostate: Secondary | ICD-10-CM | POA: Diagnosis not present

## 2019-03-07 DIAGNOSIS — Z1331 Encounter for screening for depression: Secondary | ICD-10-CM | POA: Diagnosis not present

## 2019-03-07 DIAGNOSIS — Z139 Encounter for screening, unspecified: Secondary | ICD-10-CM | POA: Diagnosis not present

## 2019-03-07 DIAGNOSIS — Z0000 Encounter for general adult medical examination without abnormal findings: Secondary | ICD-10-CM | POA: Diagnosis not present

## 2019-03-08 DIAGNOSIS — Z03818 Encounter for observation for suspected exposure to other biological agents ruled out: Secondary | ICD-10-CM | POA: Diagnosis not present

## 2019-03-08 DIAGNOSIS — Z20818 Contact with and (suspected) exposure to other bacterial communicable diseases: Secondary | ICD-10-CM | POA: Diagnosis not present

## 2019-04-25 DIAGNOSIS — Z23 Encounter for immunization: Secondary | ICD-10-CM | POA: Diagnosis not present

## 2019-06-28 DIAGNOSIS — I10 Essential (primary) hypertension: Secondary | ICD-10-CM | POA: Diagnosis not present

## 2019-06-28 DIAGNOSIS — R739 Hyperglycemia, unspecified: Secondary | ICD-10-CM | POA: Diagnosis not present

## 2019-06-28 DIAGNOSIS — E876 Hypokalemia: Secondary | ICD-10-CM | POA: Diagnosis not present

## 2019-06-28 DIAGNOSIS — M109 Gout, unspecified: Secondary | ICD-10-CM | POA: Diagnosis not present

## 2019-06-28 DIAGNOSIS — E7800 Pure hypercholesterolemia, unspecified: Secondary | ICD-10-CM | POA: Diagnosis not present

## 2019-07-18 DIAGNOSIS — M25511 Pain in right shoulder: Secondary | ICD-10-CM | POA: Diagnosis not present

## 2019-07-18 DIAGNOSIS — E785 Hyperlipidemia, unspecified: Secondary | ICD-10-CM | POA: Diagnosis not present

## 2019-07-18 DIAGNOSIS — N289 Disorder of kidney and ureter, unspecified: Secondary | ICD-10-CM | POA: Diagnosis not present

## 2019-07-18 DIAGNOSIS — G8929 Other chronic pain: Secondary | ICD-10-CM | POA: Diagnosis not present

## 2019-07-18 DIAGNOSIS — Z125 Encounter for screening for malignant neoplasm of prostate: Secondary | ICD-10-CM | POA: Diagnosis not present

## 2019-07-18 DIAGNOSIS — I10 Essential (primary) hypertension: Secondary | ICD-10-CM | POA: Diagnosis not present

## 2019-07-22 DIAGNOSIS — M19011 Primary osteoarthritis, right shoulder: Secondary | ICD-10-CM | POA: Diagnosis not present

## 2019-09-10 DIAGNOSIS — M25511 Pain in right shoulder: Secondary | ICD-10-CM | POA: Diagnosis not present

## 2019-09-20 ENCOUNTER — Encounter
Admission: RE | Admit: 2019-09-20 | Discharge: 2019-09-20 | Disposition: A | Discharge status: Home / Self Care | Payer: Medicare Other | Source: Ambulatory Visit | Attending: Orthopedic Surgery | Admitting: Orthopedic Surgery

## 2019-09-20 ENCOUNTER — Other Ambulatory Visit: Payer: Self-pay

## 2019-09-20 ENCOUNTER — Other Ambulatory Visit: Payer: Self-pay | Admitting: Orthopedic Surgery

## 2019-09-20 DIAGNOSIS — Z01818 Encounter for other preprocedural examination: Secondary | ICD-10-CM | POA: Insufficient documentation

## 2019-09-20 NOTE — Patient Instructions (Signed)
Your procedure is scheduled on: Wed. 2/17 Report to Day Surgery.   [facility] To find out your arrival time please call [phone] between 1PM - 3PM on Tues 2/16  Remember: Instructions that are not followed completely may result in serious medical risk,  up to and including death, or upon the discretion of your surgeon and anesthesiologist your  surgery may need to be rescheduled.     _X__ 1. Do not eat food after midnight the night before your procedure.                 No gum chewing or hard candies. You may drink clear liquids up to 2 hours                 before you are scheduled to arrive for your surgery- DO not drink clear                 liquids within 2 hours of the start of your surgery.                 Clear Liquids include:  water, apple juice without pulp, clear Gatorade, G2 or                  Gatorade Zero (avoid Red/Purple/Blue), Black Coffee or Tea (Do not add                 anything to coffee or tea). _____2.   Complete the carbohydrate drink provided to you, 2 hours before arrival.  __X__2.  On the morning of surgery brush your teeth with toothpaste and water, you                may rinse your mouth with mouthwash if you wish.  Do not swallow any toothpaste of mouthwash.     ___ 3.  No Alcohol for 24 hours before or after surgery.   ___ 4.  Do Not Smoke or use e-cigarettes For 24 Hours Prior to Your Surgery.                 Do not use any chewable tobacco products for at least 6 hours prior to                 surgery.  ____  5.  Bring all medications with you on the day of surgery if instructed.   _x___  6.  Notify your doctor if there is any change in your medical condition      (cold, fever, infections).     Do not wear jewelry, make-up, hairpins, clips or nail polish. Do not wear lotions, powders, or perfumes. You may wear deodorant. Do not shave 48 hours prior to surgery. Men may shave face and neck. Do not bring valuables to the  hospital.    [facility] is not responsible for any belongings or valuables.  Contacts, dentures or bridgework may not be worn into surgery. Leave your suitcase in the car. After surgery it may be brought to your room. For patients admitted to the hospital, discharge time is determined by your treatment team.   Patients discharged the day of surgery will not be allowed to drive home.   Make arrangements for someone to be with you for the first 24 hours of your Same Day Discharge.    Please read over the following fact sheets that you were given:    _x___ Take these medicines the morning of surgery with A SIP OF  WATER:    1. none  2.   3.   4.  5.  6.  ____ Fleet Enema (as directed)   __x__ Use CHG Soap (or wipes) as directed  __x__ Use Benzoyl Peroxide Gel as instructed  ____ Use inhalers on the day of surgery  ____ Stop metformin 2 days prior to surgery    ____ Take 1/2 of usual insulin dose the night before surgery. No insulin the morning          of surgery.   __x__ Stopped aspirin on 09/14/19  __x__ Stop Anti-inflammatories meloxicam (MOBIC) 7.5 MG tablet, indomethacin (INDOCIN) 25 MG capsule  today    __x__ Stop supplements until after surgery.  Garlic 10 MG CAPS,Melatonin 3 MG TABS,Omega-3 Fatty Acids (FISH OIL) 1000 MG CAPS  __x__ Use C-Pap at home during the day on the day of surgery to help inflate your lungs.

## 2019-09-20 NOTE — Pre-Procedure Instructions (Signed)
Secure Chat to Dr. Harlow Mares to inform him that the side of surgery is the RIGHT. Listed as left on surgery schedule.  He had CBC and Met B on 07/18/19 which were normal and an EKG and UA on 09/10/19 at his PCP which were WNL  We have received a clearance note from the PCP. Requested that correction be made in surgery schedule and orders placed in computer.

## 2019-09-23 ENCOUNTER — Other Ambulatory Visit
Admission: RE | Admit: 2019-09-23 | Discharge: 2019-09-23 | Disposition: A | Discharge status: Home / Self Care | Payer: Medicare Other | Source: Ambulatory Visit | Attending: Orthopedic Surgery | Admitting: Orthopedic Surgery

## 2019-09-23 ENCOUNTER — Other Ambulatory Visit: Payer: Self-pay

## 2019-09-23 DIAGNOSIS — Z20822 Contact with and (suspected) exposure to covid-19: Secondary | ICD-10-CM | POA: Diagnosis not present

## 2019-09-23 DIAGNOSIS — Z01812 Encounter for preprocedural laboratory examination: Secondary | ICD-10-CM | POA: Insufficient documentation

## 2019-09-23 LAB — TYPE AND SCREEN
ABO/RH(D): O POS
Antibody Screen: NEGATIVE
Extend sample reason: UNDETERMINED

## 2019-09-23 LAB — PROTIME-INR
INR: 1 (ref 0.8–1.2)
Prothrombin Time: 12.8 seconds (ref 11.4–15.2)

## 2019-09-23 LAB — SURGICAL PCR SCREEN
MRSA, PCR: NEGATIVE
Staphylococcus aureus: NEGATIVE

## 2019-09-23 LAB — APTT: aPTT: 36 seconds (ref 24–36)

## 2019-09-24 LAB — SARS CORONAVIRUS 2 (TAT 6-24 HRS): SARS Coronavirus 2: NEGATIVE

## 2019-09-24 MED ORDER — TRANEXAMIC ACID-NACL 1000-0.7 MG/100ML-% IV SOLN
1000.0000 mg | INTRAVENOUS | Status: AC
  Administered 2019-09-25: 1000 mg via INTRAVENOUS

## 2019-09-24 MED ORDER — DEXTROSE 5 % IV SOLN
3.0000 g | INTRAVENOUS | Status: AC
  Administered 2019-09-25: 3 g via INTRAVENOUS
  Filled 2019-09-24: qty 3

## 2019-09-25 ENCOUNTER — Ambulatory Visit: Payer: Medicare Other | Admitting: Anesthesiology

## 2019-09-25 ENCOUNTER — Observation Stay: Payer: Medicare Other

## 2019-09-25 ENCOUNTER — Encounter
Admission: RE | Disposition: A | Discharge status: Home / Self Care | Payer: Self-pay | Source: Home / Self Care | Attending: Orthopedic Surgery

## 2019-09-25 ENCOUNTER — Encounter: Payer: Self-pay | Admitting: Orthopedic Surgery

## 2019-09-25 ENCOUNTER — Ambulatory Visit: Payer: Medicare Other

## 2019-09-25 ENCOUNTER — Observation Stay
Admission: RE | Admit: 2019-09-25 | Discharge: 2019-09-26 | Disposition: A | Discharge status: Home / Self Care | Payer: Medicare Other | Attending: Orthopedic Surgery | Admitting: Orthopedic Surgery

## 2019-09-25 ENCOUNTER — Other Ambulatory Visit: Payer: Self-pay

## 2019-09-25 DIAGNOSIS — I129 Hypertensive chronic kidney disease with stage 1 through stage 4 chronic kidney disease, or unspecified chronic kidney disease: Secondary | ICD-10-CM | POA: Diagnosis not present

## 2019-09-25 DIAGNOSIS — Z471 Aftercare following joint replacement surgery: Secondary | ICD-10-CM | POA: Diagnosis not present

## 2019-09-25 DIAGNOSIS — M25511 Pain in right shoulder: Secondary | ICD-10-CM | POA: Diagnosis not present

## 2019-09-25 DIAGNOSIS — E785 Hyperlipidemia, unspecified: Secondary | ICD-10-CM | POA: Insufficient documentation

## 2019-09-25 DIAGNOSIS — Z96611 Presence of right artificial shoulder joint: Secondary | ICD-10-CM

## 2019-09-25 DIAGNOSIS — N189 Chronic kidney disease, unspecified: Secondary | ICD-10-CM | POA: Diagnosis not present

## 2019-09-25 DIAGNOSIS — Z79899 Other long term (current) drug therapy: Secondary | ICD-10-CM | POA: Insufficient documentation

## 2019-09-25 DIAGNOSIS — M19012 Primary osteoarthritis, left shoulder: Secondary | ICD-10-CM | POA: Diagnosis not present

## 2019-09-25 DIAGNOSIS — Z87891 Personal history of nicotine dependence: Secondary | ICD-10-CM | POA: Insufficient documentation

## 2019-09-25 DIAGNOSIS — G4730 Sleep apnea, unspecified: Secondary | ICD-10-CM | POA: Insufficient documentation

## 2019-09-25 DIAGNOSIS — Z7982 Long term (current) use of aspirin: Secondary | ICD-10-CM | POA: Insufficient documentation

## 2019-09-25 DIAGNOSIS — G8918 Other acute postprocedural pain: Secondary | ICD-10-CM | POA: Diagnosis not present

## 2019-09-25 DIAGNOSIS — Z419 Encounter for procedure for purposes other than remedying health state, unspecified: Secondary | ICD-10-CM

## 2019-09-25 DIAGNOSIS — M19011 Primary osteoarthritis, right shoulder: Principal | ICD-10-CM | POA: Insufficient documentation

## 2019-09-25 HISTORY — PX: TOTAL SHOULDER ARTHROPLASTY: SHX126

## 2019-09-25 SURGERY — ARTHROPLASTY, SHOULDER, TOTAL
Anesthesia: General | Site: Shoulder | Laterality: Right

## 2019-09-25 MED ORDER — LACTATED RINGERS IV SOLN: INTRAVENOUS | Status: DC | PRN

## 2019-09-25 MED ORDER — METOCLOPRAMIDE HCL 10 MG PO TABS: 5.0000 mg | ORAL_TABLET | Freq: Three times a day (TID) | ORAL | Status: DC | PRN

## 2019-09-25 MED ORDER — MENTHOL 3 MG MT LOZG
1.0000 | LOZENGE | OROMUCOSAL | Status: DC | PRN
  Filled 2019-09-25: qty 9

## 2019-09-25 MED ORDER — ONDANSETRON HCL 4 MG/2ML IJ SOLN: 4.0000 mg | Freq: Once | INTRAMUSCULAR | Status: DC | PRN

## 2019-09-25 MED ORDER — ACETAMINOPHEN 500 MG PO TABS
500.0000 mg | ORAL_TABLET | Freq: Four times a day (QID) | ORAL | Status: AC
  Administered 2019-09-25 – 2019-09-26 (×4): 500 mg via ORAL
  Filled 2019-09-25 (×4): qty 1

## 2019-09-25 MED ORDER — SUGAMMADEX SODIUM 500 MG/5ML IV SOLN
INTRAVENOUS | Status: DC | PRN
  Administered 2019-09-25: 200 mg via INTRAVENOUS

## 2019-09-25 MED ORDER — SODIUM CHLORIDE 0.9 % IR SOLN: Status: DC | PRN

## 2019-09-25 MED ORDER — FAMOTIDINE 20 MG PO TABS: 20.0000 mg | ORAL_TABLET | Freq: Once | ORAL | Status: DC

## 2019-09-25 MED ORDER — HYDROCODONE-ACETAMINOPHEN 7.5-325 MG PO TABS: 1.0000 | ORAL_TABLET | ORAL | Status: DC | PRN

## 2019-09-25 MED ORDER — BUPIVACAINE LIPOSOME 1.3 % IJ SUSP
INTRAMUSCULAR | Status: DC | PRN
  Administered 2019-09-25: 20 mL

## 2019-09-25 MED ORDER — EPINEPHRINE PF 1 MG/ML IJ SOLN
INTRAMUSCULAR | Status: AC
  Filled 2019-09-25: qty 1

## 2019-09-25 MED ORDER — ONDANSETRON HCL 4 MG/2ML IJ SOLN: 4.0000 mg | Freq: Four times a day (QID) | INTRAMUSCULAR | Status: DC | PRN

## 2019-09-25 MED ORDER — LIDOCAINE HCL (CARDIAC) PF 100 MG/5ML IV SOSY
PREFILLED_SYRINGE | INTRAVENOUS | Status: DC | PRN
  Administered 2019-09-25: 80 mg via INTRAVENOUS

## 2019-09-25 MED ORDER — HYDROCODONE-ACETAMINOPHEN 5-325 MG PO TABS: 1.0000 | ORAL_TABLET | ORAL | Status: DC | PRN

## 2019-09-25 MED ORDER — DEXAMETHASONE SODIUM PHOSPHATE 10 MG/ML IJ SOLN
INTRAMUSCULAR | Status: DC | PRN
  Administered 2019-09-25: 10 mg via INTRAVENOUS

## 2019-09-25 MED ORDER — TRANEXAMIC ACID-NACL 1000-0.7 MG/100ML-% IV SOLN
INTRAVENOUS | Status: AC
  Filled 2019-09-25: qty 100

## 2019-09-25 MED ORDER — ONDANSETRON HCL 4 MG/2ML IJ SOLN
INTRAMUSCULAR | Status: DC | PRN
  Administered 2019-09-25: 4 mg via INTRAVENOUS

## 2019-09-25 MED ORDER — BUPIVACAINE-EPINEPHRINE (PF) 0.25% -1:200000 IJ SOLN
INTRAMUSCULAR | Status: DC | PRN
  Administered 2019-09-25: 23 mL

## 2019-09-25 MED ORDER — MAGNESIUM OXIDE 400 (241.3 MG) MG PO TABS
400.0000 mg | ORAL_TABLET | Freq: Every day | ORAL | Status: DC
  Administered 2019-09-25 – 2019-09-26 (×2): 400 mg via ORAL
  Filled 2019-09-25 (×2): qty 1

## 2019-09-25 MED ORDER — FENTANYL CITRATE (PF) 100 MCG/2ML IJ SOLN
INTRAMUSCULAR | Status: AC
  Filled 2019-09-25: qty 2

## 2019-09-25 MED ORDER — AMLODIPINE BESYLATE 10 MG PO TABS
10.0000 mg | ORAL_TABLET | Freq: Every day | ORAL | Status: DC
  Administered 2019-09-25: 22:00:00 10 mg via ORAL
  Filled 2019-09-25: qty 1

## 2019-09-25 MED ORDER — FENTANYL CITRATE (PF) 100 MCG/2ML IJ SOLN
50.0000 ug | INTRAMUSCULAR | Status: AC | PRN
  Administered 2019-09-25: 100 ug via INTRAVENOUS

## 2019-09-25 MED ORDER — BACITRACIN 50000 UNITS IM SOLR
INTRAMUSCULAR | Status: AC
  Filled 2019-09-25: qty 2

## 2019-09-25 MED ORDER — DOCUSATE SODIUM 100 MG PO CAPS
100.0000 mg | ORAL_CAPSULE | Freq: Two times a day (BID) | ORAL | Status: DC
  Administered 2019-09-26 (×2): 100 mg via ORAL
  Filled 2019-09-25 (×2): qty 1

## 2019-09-25 MED ORDER — SODIUM CHLORIDE FLUSH 0.9 % IV SOLN
INTRAVENOUS | Status: AC
  Filled 2019-09-25: qty 10

## 2019-09-25 MED ORDER — LOSARTAN POTASSIUM 50 MG PO TABS
100.0000 mg | ORAL_TABLET | Freq: Every day | ORAL | Status: DC
  Administered 2019-09-26: 10:00:00 100 mg via ORAL
  Filled 2019-09-25: qty 2

## 2019-09-25 MED ORDER — OXYCODONE HCL 5 MG/5ML PO SOLN: 5.0000 mg | Freq: Once | ORAL | Status: DC | PRN

## 2019-09-25 MED ORDER — BUPIVACAINE HCL (PF) 0.5 % IJ SOLN
INTRAMUSCULAR | Status: AC
  Filled 2019-09-25: qty 10

## 2019-09-25 MED ORDER — CHLORHEXIDINE GLUCONATE 4 % EX LIQD
60.0000 mL | Freq: Once | CUTANEOUS | Status: AC
  Administered 2019-09-25: 4 via TOPICAL

## 2019-09-25 MED ORDER — GLYCOPYRROLATE 0.2 MG/ML IJ SOLN
INTRAMUSCULAR | Status: AC
  Filled 2019-09-25: qty 1

## 2019-09-25 MED ORDER — LACTATED RINGERS IV SOLN: INTRAVENOUS | Status: DC

## 2019-09-25 MED ORDER — ONDANSETRON HCL 4 MG PO TABS: 4.0000 mg | ORAL_TABLET | Freq: Four times a day (QID) | ORAL | Status: DC | PRN

## 2019-09-25 MED ORDER — BUPIVACAINE HCL (PF) 0.5 % IJ SOLN
INTRAMUSCULAR | Status: DC | PRN
  Administered 2019-09-25: 8 mL

## 2019-09-25 MED ORDER — PHENOL 1.4 % MT LIQD
1.0000 | OROMUCOSAL | Status: DC | PRN
  Filled 2019-09-25: qty 177

## 2019-09-25 MED ORDER — ROCURONIUM BROMIDE 50 MG/5ML IV SOLN
INTRAVENOUS | Status: AC
  Filled 2019-09-25: qty 1

## 2019-09-25 MED ORDER — ACETAMINOPHEN 10 MG/ML IV SOLN
INTRAVENOUS | Status: DC | PRN
  Administered 2019-09-25: 1000 mg via INTRAVENOUS

## 2019-09-25 MED ORDER — LIDOCAINE HCL (PF) 1 % IJ SOLN
INTRAMUSCULAR | Status: AC
  Filled 2019-09-25: qty 5

## 2019-09-25 MED ORDER — FENTANYL CITRATE (PF) 100 MCG/2ML IJ SOLN: 25.0000 ug | INTRAMUSCULAR | Status: DC | PRN

## 2019-09-25 MED ORDER — DEXAMETHASONE SODIUM PHOSPHATE 10 MG/ML IJ SOLN
INTRAMUSCULAR | Status: AC
  Filled 2019-09-25: qty 1

## 2019-09-25 MED ORDER — BUPIVACAINE LIPOSOME 1.3 % IJ SUSP
INTRAMUSCULAR | Status: AC
  Filled 2019-09-25: qty 20

## 2019-09-25 MED ORDER — LIDOCAINE HCL (PF) 2 % IJ SOLN
INTRAMUSCULAR | Status: AC
  Filled 2019-09-25: qty 5

## 2019-09-25 MED ORDER — MORPHINE SULFATE (PF) 4 MG/ML IV SOLN: 0.5000 mg | INTRAVENOUS | Status: DC | PRN

## 2019-09-25 MED ORDER — OXYCODONE HCL 5 MG PO TABS: 5.0000 mg | ORAL_TABLET | Freq: Once | ORAL | Status: DC | PRN

## 2019-09-25 MED ORDER — ACETAMINOPHEN 10 MG/ML IV SOLN
INTRAVENOUS | Status: AC
  Filled 2019-09-25: qty 100

## 2019-09-25 MED ORDER — PROPOFOL 10 MG/ML IV BOLUS
INTRAVENOUS | Status: DC | PRN
  Administered 2019-09-25: 120 mg via INTRAVENOUS
  Administered 2019-09-25: 40 mg via INTRAVENOUS

## 2019-09-25 MED ORDER — ACETAMINOPHEN 325 MG PO TABS: 325.0000 mg | ORAL_TABLET | Freq: Four times a day (QID) | ORAL | Status: DC | PRN

## 2019-09-25 MED ORDER — ONDANSETRON HCL 4 MG/2ML IJ SOLN
INTRAMUSCULAR | Status: AC
  Filled 2019-09-25: qty 2

## 2019-09-25 MED ORDER — KETOROLAC TROMETHAMINE 15 MG/ML IJ SOLN
7.5000 mg | Freq: Four times a day (QID) | INTRAMUSCULAR | Status: DC
  Administered 2019-09-25 – 2019-09-26 (×3): 7.5 mg via INTRAVENOUS
  Filled 2019-09-25 (×3): qty 1

## 2019-09-25 MED ORDER — FENTANYL CITRATE (PF) 100 MCG/2ML IJ SOLN
INTRAMUSCULAR | Status: AC
  Administered 2019-09-25: 10:00:00 50 ug via INTRAVENOUS
  Filled 2019-09-25: qty 2

## 2019-09-25 MED ORDER — PHENYLEPHRINE HCL (PRESSORS) 10 MG/ML IV SOLN
INTRAVENOUS | Status: DC | PRN
  Administered 2019-09-25 (×9): 100 ug via INTRAVENOUS

## 2019-09-25 MED ORDER — ROCURONIUM BROMIDE 100 MG/10ML IV SOLN
INTRAVENOUS | Status: DC | PRN
  Administered 2019-09-25: 30 mg via INTRAVENOUS
  Administered 2019-09-25: 50 mg via INTRAVENOUS

## 2019-09-25 MED ORDER — POTASSIUM CHLORIDE CRYS ER 20 MEQ PO TBCR
20.0000 meq | EXTENDED_RELEASE_TABLET | Freq: Every day | ORAL | Status: DC
  Administered 2019-09-25 – 2019-09-26 (×2): 20 meq via ORAL
  Filled 2019-09-25 (×2): qty 1

## 2019-09-25 MED ORDER — METOCLOPRAMIDE HCL 5 MG/ML IJ SOLN: 5.0000 mg | Freq: Three times a day (TID) | INTRAMUSCULAR | Status: DC | PRN

## 2019-09-25 MED ORDER — SODIUM CHLORIDE FLUSH 0.9 % IV SOLN
INTRAVENOUS | Status: AC
  Filled 2019-09-25: qty 20

## 2019-09-25 MED ORDER — SUGAMMADEX SODIUM 200 MG/2ML IV SOLN
INTRAVENOUS | Status: AC
  Filled 2019-09-25: qty 2

## 2019-09-25 MED ORDER — DOXAZOSIN MESYLATE 4 MG PO TABS
8.0000 mg | ORAL_TABLET | Freq: Every day | ORAL | Status: DC
  Administered 2019-09-26: 01:00:00 8 mg via ORAL
  Filled 2019-09-25: qty 1
  Filled 2019-09-25: qty 2
  Filled 2019-09-25: qty 1
  Filled 2019-09-25: qty 2
  Filled 2019-09-25: qty 1

## 2019-09-25 MED ORDER — EPHEDRINE SULFATE 50 MG/ML IJ SOLN
INTRAMUSCULAR | Status: AC
  Filled 2019-09-25: qty 1

## 2019-09-25 MED ORDER — BUPIVACAINE HCL (PF) 0.25 % IJ SOLN
INTRAMUSCULAR | Status: AC
  Filled 2019-09-25: qty 30

## 2019-09-25 MED ORDER — EPHEDRINE SULFATE 50 MG/ML IJ SOLN
INTRAMUSCULAR | Status: DC | PRN
  Administered 2019-09-25 (×3): 10 mg via INTRAVENOUS

## 2019-09-25 MED ORDER — GLYCOPYRROLATE 0.2 MG/ML IJ SOLN
INTRAMUSCULAR | Status: DC | PRN
  Administered 2019-09-25: .2 mg via INTRAVENOUS

## 2019-09-25 MED ORDER — ACETAMINOPHEN 10 MG/ML IV SOLN: 1000.0000 mg | Freq: Once | INTRAVENOUS | Status: DC | PRN

## 2019-09-25 SURGICAL SUPPLY — 61 items
BLADE BOVIE TIP EXT 4 (BLADE) ×3 IMPLANT
BLADE SAW 1 (BLADE) ×3 IMPLANT
BODY UNITE ANATOMIC SZ12 (Miscellaneous) ×3 IMPLANT
BOWL CEMENT MIX W/ADAPTER (MISCELLANEOUS) ×6 IMPLANT
BRUSH SCRUB EZ  4% CHG (MISCELLANEOUS) ×4
BRUSH SCRUB EZ 4% CHG (MISCELLANEOUS) ×2 IMPLANT
CANISTER SUCT 1200ML W/VALVE (MISCELLANEOUS) ×3 IMPLANT
CANISTER SUCT 3000ML PPV (MISCELLANEOUS) ×6 IMPLANT
CEMENT BONE 1-PACK (Cement) ×3 IMPLANT
CHLORAPREP W/TINT 26 (MISCELLANEOUS) ×6 IMPLANT
COVER BACK TABLE REUSABLE LG (DRAPES) ×3 IMPLANT
COVER WAND RF STERILE (DRAPES) ×3 IMPLANT
CRADLE LAMINECT ARM (MISCELLANEOUS) ×6 IMPLANT
DRAPE 3/4 80X56 (DRAPES) ×6 IMPLANT
DRAPE IMP U-DRAPE 54X76 (DRAPES) ×3 IMPLANT
DRAPE INCISE IOBAN 66X60 STRL (DRAPES) ×6 IMPLANT
DRAPE U-SHAPE 47X51 STRL (DRAPES) ×3 IMPLANT
DRSG TEGADERM 6X8 (GAUZE/BANDAGES/DRESSINGS) ×3 IMPLANT
ELECT REM PT RETURN 9FT ADLT (ELECTROSURGICAL) ×3
ELECTRODE REM PT RTRN 9FT ADLT (ELECTROSURGICAL) ×1 IMPLANT
GAUZE SPONGE 4X4 12PLY STRL (GAUZE/BANDAGES/DRESSINGS) IMPLANT
GAUZE XEROFORM 1X8 LF (GAUZE/BANDAGES/DRESSINGS) ×3 IMPLANT
GLENOID ANCHOR PEG CROSSLK 48 (Orthopedic Implant) ×3 IMPLANT
GLOVE INDICATOR 8.0 STRL GRN (GLOVE) ×3 IMPLANT
GLOVE SURG ORTHO 8.0 STRL STRW (GLOVE) ×3 IMPLANT
GOWN STRL REUS W/ TWL LRG LVL3 (GOWN DISPOSABLE) ×1 IMPLANT
GOWN STRL REUS W/ TWL XL LVL3 (GOWN DISPOSABLE) ×1 IMPLANT
GOWN STRL REUS W/TWL LRG LVL3 (GOWN DISPOSABLE) ×2
GOWN STRL REUS W/TWL XL LVL3 (GOWN DISPOSABLE) ×2
HEAD HUMERAL ECC 52X18MM (Head) ×3 IMPLANT
HOOD PEEL AWAY FLYTE STAYCOOL (MISCELLANEOUS) ×9 IMPLANT
IV NS 1000ML (IV SOLUTION) ×2
IV NS 1000ML BAXH (IV SOLUTION) ×1 IMPLANT
KIT STABILIZATION SHOULDER (MISCELLANEOUS) ×3 IMPLANT
KIT TURNOVER KIT A (KITS) ×3 IMPLANT
MASK FACE SPIDER DISP (MASK) ×3 IMPLANT
MAT ABSORB  FLUID 56X50 GRAY (MISCELLANEOUS) ×2
MAT ABSORB FLUID 56X50 GRAY (MISCELLANEOUS) ×1 IMPLANT
NDL SAFETY ECLIPSE 18X1.5 (NEEDLE) IMPLANT
NEEDLE HYPO 18GX1.5 SHARP (NEEDLE)
NEEDLE REVERSE CUT 1/2 CRC (NEEDLE) ×3 IMPLANT
NS IRRIG 1000ML POUR BTL (IV SOLUTION) ×3 IMPLANT
PACK ARTHROSCOPY SHOULDER (MISCELLANEOUS) ×3 IMPLANT
PIN METAGLENE 2.5 (PIN) ×6 IMPLANT
PULSAVAC PLUS IRRIG FAN TIP (DISPOSABLE) ×3
SLING ARM LRG DEEP (SOFTGOODS) ×3 IMPLANT
SPONGE LAP 18X18 RF (DISPOSABLE) ×3 IMPLANT
STAPLER SKIN PROX 35W (STAPLE) ×3 IMPLANT
STEM UNITE SZ12 (Stem) ×3 IMPLANT
STRAP SAFETY 5IN WIDE (MISCELLANEOUS) ×3 IMPLANT
SUT TICRON 2-0 30IN 311381 (SUTURE) IMPLANT
SUT VIC AB 0 CT2 27 (SUTURE) ×3 IMPLANT
SUT VIC AB 2-0 CT1 18 (SUTURE) ×3 IMPLANT
SUT VIC AB PLUS 45CM 1-MO-4 (SUTURE) IMPLANT
SYR 10ML LL (SYRINGE) ×3 IMPLANT
SYR TOOMEY IRRIG 70ML (MISCELLANEOUS) ×3
SYRINGE IRR TOOMEY STRL 70CC (SYRINGE) ×3 IMPLANT
SYRINGE TOOMEY IRRIG 70ML (MISCELLANEOUS) ×1 IMPLANT
TAPE SUT 30 1/2 CRC GREEN (SUTURE) ×6 IMPLANT
TIP FAN IRRIG PULSAVAC PLUS (DISPOSABLE) ×1 IMPLANT
WATER STERILE IRR 1000ML POUR (IV SOLUTION) ×6 IMPLANT

## 2019-09-25 NOTE — Op Note (Signed)
09/25/2019  2:15 PM  Patient:   Joel Mcintosh  Pre-Op Diagnosis:   Degenerative joint disease, right shoulder.  Post-Op Diagnosis:   Same.  Procedure:   Right total shoulder arthroplasty.  Surgeon:   Cassell Smiles, MD  Assistant:  Altamese Cabal, PA-C  Anesthesia:   General endotracheal intubation with an interscalene block.  Findings:   As above. The rotator cuff was in satisfactory condition.  Complications:   None  EBL:  100 cc  Drains:   None  Closure:   Staples  Implants:   [employer] system with a standard stem size 12  With a 135 degree porocoat proximal body size 12, a 52 x 18 mm humeral head, and a cemented glenoid component size 48 mm.  Brief Clinical Note:   The patient is a 79 year old man with chronic and severe right shoulder pain. The patient presents at this time for a right total shoulder arthroplasty.  Procedure:   The patient was brought into the operating room and lain in the supine position on the OR table. After adequate IV sedation was achieved, an interscalene block was placed by the anesthesiologist. The patient then underwent general endotracheal intubation and anesthesia before being repositioned in the beach chair position using the beach chair positioner. A Foley catheter was placed by the nurse. The right shoulder and upper extremity were prepped with ChloraPrep solution before being draped sterilely. Preoperative antibiotics were administered. A standard anterior approach to the shoulder was made through an approximately 4-5 inch incision. The incision was carried down through the subcutaneous tissues to expose the deltopectoral fascia. The interval between the deltoid and pectoralis muscles was identified and this plane developed, retracting the cephalic vein laterally with the deltoid muscle. The conjoined tendon was identified. The lateral margin was dissected and the Kolbel self-retraining retractor inserted. The "three sisters" were identified  and cauterized. Bursal tissues were removed to improve visualization. The subscapularis tendon was released from its attachment to the lesser tuberosity 1 cm proximal to its insertion and several tagging sutures placed. The inferior capsule was released with care after identifying and protecting the axillary nerve. The proximal humeral cut was made at approximately 30 of retroversion using the extra-medullary guide.   Attention was directed to the glenoid. The labrum was debrided circumferentially before the center of the glenoid was marked with electrocautery. The small and medium sizers were positioned and it was elected to proceed with a small glenoid component. The guidewire was drilled into the glenoid neck using the appropriate guide. After verifying its position, the glenoid was lightly reamed with the butterfly reamer before the centralizing reamer was used. The small peripheral peg guide was positioned and each of the three pegs drilled sequentially, leaving a peg in place so as to minimize shifting of the guide. The bony surfaces were prepared for cementing by irrigating them thoroughly with bacitracin saline solution using the jet lavage system. Meanwhile, cement was mixed on the back table. When it was ready, some cement was injected into each of the three peg holes using a Toomey syringe and additional cement applied to the posterior aspect of the glenoid component. The component was impacted into place and the excess cement was removed. Pressure was maintained on the glenoid until the cement hardened.  Attention was directed to the humeral side. The humeral canal was reamed sequentially beginning with the end-cutting reamer then progressing up to a 12 mm reamer. This provided excellent circumferential chatter. The canal was  prepared using a brosteotome. A trial reduction performed. The arm demonstrated excellent range of motion as the hand could be brought across the chest to the opposite shoulder  and brought to the top of the patient's head and to the patient's ear. The shoulder remained stable throughout this range of motion, and was stable with abduction and external rotation. The joint was dislocated and the trial components removed. The final components were impacted into place with care taken to maintain the appropriate version. Again, the [facility] taper locking mechanism was verified using manual distraction. The shoulder was relocated and again placed through a range of motion with the findings as described above.  The wound was copiously irrigated with bacitracin saline solution using the jet lavage system. The subscapularis tendon was reapproximated using 56mm cottony Dacron sutures. The deltopectoral interval was closed using #0 Vicryl interrupted sutures before the subcutaneous tissues were closed using 2-0 Vicryl interrupted sutures. The skin was closed using staples. A sterile occlusive dressing was applied to the wound before the arm was placed into a sling. The patient was then transferred back to a hospital bed before being awakened, extubated, and returned to the recovery room in satisfactory condition after tolerating the procedure well.

## 2019-09-25 NOTE — Anesthesia Procedure Notes (Signed)
Procedure Name: Intubation Date/Time: 09/25/2019 11:17 AM Performed by: Almeta Monas, CRNA Pre-anesthesia Checklist: Patient identified, Patient being monitored, Timeout performed, Emergency Drugs available and Suction available Patient Re-evaluated:Patient Re-evaluated prior to induction Oxygen Delivery Method: Circle system utilized Preoxygenation: Pre-oxygenation with 100% oxygen Induction Type: IV induction Ventilation: Two handed mask ventilation required and Oral airway inserted - appropriate to patient size Laryngoscope Size: 3 and McGraph Grade View: Grade I Tube type: Oral Tube size: 7.0 mm Number of attempts: 1 Airway Equipment and Method: Stylet and Video-laryngoscopy Placement Confirmation: ETT inserted through vocal cords under direct vision,  positive ETCO2 and breath sounds checked- equal and bilateral Secured at: 21 cm Tube secured with: Tape Dental Injury: Teeth and Oropharynx as per pre-operative assessment

## 2019-09-25 NOTE — Transfer of Care (Deleted)
Immediate Anesthesia Transfer of Care Note  Patient: Joel Mcintosh  Procedure(s) Performed: TOTAL SHOULDER ARTHROPLASTY (Right Shoulder)  Patient Location: PACU  Anesthesia Type:General  Level of Consciousness: sedated  Airway & Oxygen Therapy: Patient Spontanous Breathing and Patient connected to face mask oxygen  Post-op Assessment: VSS Post vital signs: Reviewed and stable  Last Vitals:  Vitals Value Taken Time  BP    Temp    Pulse    Resp    SpO2      Last Pain: There were no vitals filed for this visit.       Complications: No apparent anesthesia complications

## 2019-09-25 NOTE — Anesthesia Postprocedure Evaluation (Signed)
Anesthesia Post Note  Patient: Joel Mcintosh  Procedure(s) Performed: TOTAL SHOULDER ARTHROPLASTY (Right Shoulder)  Patient location during evaluation: PACU Anesthesia Type: General Level of consciousness: awake and alert Pain management: pain level controlled Vital Signs Assessment: post-procedure vital signs reviewed and stable Respiratory status: spontaneous breathing, nonlabored ventilation, respiratory function stable and patient connected to nasal cannula oxygen Cardiovascular status: blood pressure returned to baseline and stable Postop Assessment: no apparent nausea or vomiting Anesthetic complications: no     Last Vitals:  Vitals:   09/25/19 1440 09/25/19 1455  BP: 117/76 115/64  Pulse: (!) 58 64  Resp: 10 15  Temp:    SpO2: 97% 98%    Last Pain:  Vitals:   09/25/19 1455  PainSc: Asleep                 Corinda Gubler

## 2019-09-25 NOTE — Anesthesia Procedure Notes (Signed)
Anesthesia Regional Block: Interscalene brachial plexus block   Pre-Anesthetic Checklist: ,, timeout performed, Correct Patient, Correct Site, Correct Laterality, Correct Procedure, Correct Position, site marked, Risks and benefits discussed,  Surgical consent,  Pre-op evaluation,  At surgeon's request and post-op pain management  Laterality: Right  Prep: chloraprep       Needles:  Injection technique: Single-shot  Needle Type: Echogenic Needle     Needle Length: 4cm  Needle Gauge: 25     Additional Needles:   Narrative:  Start time: 09/25/2019 10:05 AM End time: 09/25/2019 10:13 AM Injection made incrementally with aspirations every 5 mL.  Performed by: Personally  Anesthesiologist: Corinda Gubler, MD  Additional Notes: Patient consented for risk and benefits of nerve block including but not limited to: nerve damage, failed block, bleeding and infection, hemidiaphragmatic paralysis and shortness of breath, pneumothorax, Horner's syndrome.  Patient voiced understanding.  Functioning IV was confirmed and monitors were applied.  Sterile prep,hand hygiene and sterile gloves were used.  Minimal sedation used for procedure.   No paresthesia endorsed by patient during the procedure.  Negative aspiration and negative test dose prior to incremental administration of local anesthetic. The patient tolerated the procedure well with no immediate complications.

## 2019-09-25 NOTE — Anesthesia Preprocedure Evaluation (Signed)
Anesthesia Evaluation  Patient identified by MRN, date of birth, ID band Patient awake    Reviewed: Allergy & Precautions, NPO status , Patient's Chart, lab work & pertinent test results  History of Anesthesia Complications Negative for: history of anesthetic complications  Airway Mallampati: III  TM Distance: >3 FB Neck ROM: Full    Dental no notable dental hx. (+) Teeth Intact, Dental Advisory Given   Pulmonary sleep apnea and Continuous Positive Airway Pressure Ventilation , neg COPD, Patient abstained from smoking.Not current smoker, former smoker,  In 2019 after total shoulder surgery with interscalene block, patient developed pneumothorax in PACU requiring chest tube. Has since resolved.   Pulmonary exam normal breath sounds clear to auscultation       Cardiovascular Exercise Tolerance: Good METShypertension, (-) CAD and (-) Past MI negative cardio ROS  (-) dysrhythmias  Rhythm:Regular Rate:Normal - Systolic murmurs    Neuro/Psych negative neurological ROS  negative psych ROS   GI/Hepatic neg GERD  ,(+)     (-) substance abuse  ,   Endo/Other  neg diabetes  Renal/GU CRFRenal disease     Musculoskeletal   Abdominal   Peds  Hematology   Anesthesia Other Findings Past Medical History: No date: Arthritis No date: Bradycardia No date: Chronic kidney disease     Comment:  renal insufficiency No date: Hyperlipidemia No date: Hypertension No date: Sleep apnea     Comment:  uses CPAP  Reproductive/Obstetrics                             Anesthesia Physical Anesthesia Plan  ASA: II  Anesthesia Plan: General   Post-op Pain Management:  Regional for Post-op pain   Induction: Intravenous  PONV Risk Score and Plan: 2 and Ondansetron and Dexamethasone  Airway Management Planned: Oral ETT  Additional Equipment: None  Intra-op Plan:   Post-operative Plan: Extubation in  OR  Informed Consent: I have reviewed the patients History and Physical, chart, labs and discussed the procedure including the risks, benefits and alternatives for the proposed anesthesia with the patient or authorized representative who has indicated his/her understanding and acceptance.     Dental advisory given  Plan Discussed with: CRNA and Surgeon  Anesthesia Plan Comments: (Discussed risks of anesthesia with patient, including PONV, sore throat, lip/dental damage. Rare risks discussed as well, such as cardiorespiratory sequelae. Patient understands. Discussed r/b/a of interscalene block, including elective nature. Risks discussed: - Rare: bleeding, infection, nerve damage - shortness of breath from hemidiaphragmatic paralysis - discussed very rare risk of pneumothorax with this block. - unilateral horner's syndrome - poor/non-working blocks Patient understands to all of the above including the pulmonary risks and agrees. Patient encouraged to wear CPAP nightly (patient says he wears it every night ) after block. )        Anesthesia Quick Evaluation

## 2019-09-25 NOTE — H&P (Signed)
The patient has been re-examined, and the chart reviewed, and there have been no interval changes to the documented history and physical.  Plan a right total shoulder replacement today.  Anesthesia is consulted regarding a peripheral nerve block for post-operative pain.  The risks, benefits, and alternatives have been discussed at length, and the patient is willing to proceed.     

## 2019-09-25 NOTE — Transfer of Care (Signed)
Immediate Anesthesia Transfer of Care Note  Patient: Joel Mcintosh  Procedure(s) Performed: TOTAL SHOULDER ARTHROPLASTY (Right Shoulder)  Patient Location: PACU  Anesthesia Type:General  Level of Consciousness: sedated  Airway & Oxygen Therapy: Patient Spontanous Breathing and Patient connected to face mask oxygen  Post-op Assessment: Report given to RN and Post -op Vital signs reviewed and stable  Post vital signs: Reviewed and stable  Last Vitals:  Vitals Value Taken Time  BP 123/61 09/25/19 1423  Temp    Pulse 62 09/25/19 1426  Resp 16 09/25/19 1426  SpO2 100 % 09/25/19 1426  Vitals shown include unvalidated device data.  Last Pain: There were no vitals filed for this visit.       Complications: No apparent anesthesia complications

## 2019-09-26 DIAGNOSIS — M19011 Primary osteoarthritis, right shoulder: Secondary | ICD-10-CM | POA: Diagnosis not present

## 2019-09-26 DIAGNOSIS — G4730 Sleep apnea, unspecified: Secondary | ICD-10-CM | POA: Diagnosis not present

## 2019-09-26 DIAGNOSIS — Z87891 Personal history of nicotine dependence: Secondary | ICD-10-CM | POA: Diagnosis not present

## 2019-09-26 DIAGNOSIS — Z79899 Other long term (current) drug therapy: Secondary | ICD-10-CM | POA: Diagnosis not present

## 2019-09-26 DIAGNOSIS — N189 Chronic kidney disease, unspecified: Secondary | ICD-10-CM | POA: Diagnosis not present

## 2019-09-26 DIAGNOSIS — I129 Hypertensive chronic kidney disease with stage 1 through stage 4 chronic kidney disease, or unspecified chronic kidney disease: Secondary | ICD-10-CM | POA: Diagnosis not present

## 2019-09-26 MED ORDER — DOCUSATE SODIUM 100 MG PO CAPS: 100.0000 mg | ORAL_CAPSULE | Freq: Two times a day (BID) | ORAL | 0 refills | Status: DC

## 2019-09-26 MED ORDER — HYDROCODONE-ACETAMINOPHEN 5-325 MG PO TABS: 1.0000 | ORAL_TABLET | ORAL | 0 refills | Status: AC | PRN

## 2019-09-26 MED ORDER — METHOCARBAMOL 500 MG PO TABS: 500.0000 mg | ORAL_TABLET | Freq: Four times a day (QID) | ORAL | 1 refills | Status: DC

## 2019-09-26 MED ORDER — ONDANSETRON HCL 4 MG PO TABS: 4.0000 mg | ORAL_TABLET | Freq: Four times a day (QID) | ORAL | 0 refills | Status: DC | PRN

## 2019-09-26 NOTE — Progress Notes (Signed)
DISCHARGE NOTE:  Pt given discharge instructions. Pt verbalized understanding. Pts surgical dressing clean and intact. Sling in place. Pt wheeled to car by staff. Pts son transporting pt.

## 2019-09-26 NOTE — Progress Notes (Signed)
Occupational Therapy Evaluation Patient Details Name: Joel Mcintosh MRN: [id number] DOB: [birth year] Today's Date: 09/26/2019    History of Present Illness Joel Mcintosh is a 79 year-old Male with a history of L TSA and is s/p R TSA on 09/25/19.   Clinical Impression   Joel Mcintosh was seen for an OT evaluation this date. Pt lives alone in a 2 story house c family nearby. Prior to surgery, pt was active and independent. Pt has orders for RUE to be immobilized and will be NWBing per MD. Patient presents with impaired strength/ROM, pain, and sensation to RUE with block not completely resolved yet. These impairments result in a decreased ability to perform self care tasks requiring MOD assist for UB/LB dressing and bathing and MAX assist for application of sling/immobilizer. Pt instructed in sling mgt, RUE NWBing precautions, adaptive strategies for bathing/dressing/toileting/grooming, positioning and considerations for sleep, and home/routines modifications to maximize falls prevention, safety, and independence. Handout provided. OT adjusted sling/immobilizer and polar care to improve comfort, optimize positioning, and to maximize skin integrity/safety. Pt verbalized understanding of all education/training provided. Pt will benefit from skilled OT services to address these limitations and improve independence in daily tasks. Recommend HHOT services to continue therapy to maximize return to PLOF, address home/routines modifications and safety, minimize falls risk, and minimize caregiver burden.       Follow Up Recommendations  Home health OT    Equipment Recommendations       Recommendations for Other Services       Precautions / Restrictions Precautions Precautions: Fall Precaution Comments: Per MD order: Sling at all times except ADL/exercise, NWB RUE, AROM R elbow, wrist, hand to tolerance, NO PROM R shoulder Required Braces or Orthoses: Sling Restrictions Weight Bearing Restrictions:  Yes RUE Weight Bearing: Non weight bearing      Mobility Bed Mobility Overal bed mobility: Needs Assistance Bed Mobility: Supine to Sit     Supine to sit: Supervision;HOB elevated     General bed mobility comments: MIN VCs sup>sit to maintain NWBing precautions c HOB elevated ~60*  Transfers Overall transfer level: Needs assistance   Transfers: Sit to/from Stand Sit to Stand: Supervision         General transfer comment: MIN VCs to maintain NWBing precautions sit<>stand x2    Balance Overall balance assessment: Independent                                         ADL either performed or assessed with clinical judgement   ADL Overall ADL's : Needs assistance/impaired                                       General ADL Comments: MAX A don socks seated EOB. MOD A doff socks, don boxers and pants seated EOB. MAX A button/zip pants and belt. MIN A + increased time don t-shirt seated EOB. Setup + increased time self-feeding c non dominant L hand.      Vision Baseline Vision/History: Wears glasses Wears Glasses: At all times Patient Visual Report: No change from baseline       Perception     Praxis      Pertinent Vitals/Pain Pain Assessment: No/denies pain     Hand Dominance Right   Extremity/Trunk Assessment Upper Extremity Assessment Upper Extremity Assessment: RUE  deficits/detail(LUE WFL) RUE Deficits / Details: s/p R TSA c MD orders to maintain NWBing   Lower Extremity Assessment Lower Extremity Assessment: Overall WFL for tasks assessed   Cervical / Trunk Assessment Cervical / Trunk Assessment: Normal   Communication Communication Communication: No difficulties   Cognition Arousal/Alertness: Awake/alert Behavior During Therapy: WFL for tasks assessed/performed;Impulsive Overall Cognitive Status: Within Functional Limits for tasks assessed                                 General Comments: Pt repeatedly  stated "I can do this, let me do it" during LBD.    General Comments       Exercises Exercises: Other exercises Other Exercises Other Exercises: Pt educated re: sling management, falls prevention, hemi-dressing techniques, importance of assistance for ADLs, safe bathing techniques Other Exercises: LBD, UBD, bed mobility, sitting balance/tolerance, sit<>stand, don/doff sling   Shoulder Instructions      Home Living Family/patient expects to be discharged to:: Private residence Living Arrangements: Alone Available Help at Discharge: Family(Son) Type of Home: House Home Access: Stairs to enter [employer] of Steps: 1 Entrance Stairs-Rails: None Home Layout: Two level;Able to live on main level with bedroom/bathroom Alternate Level Stairs-Number of Steps: flight   Bathroom Shower/Tub: Chief Strategy Officer: Standard Bathroom Accessibility: Yes How Accessible: Accessible via walker Home Equipment: Bedside commode          Prior Functioning/Environment Level of Independence: Independent        Comments: Pt reports living alone since his wife passed in May of 2020. He was Independent c community mobility, medication management, and ADLs. He reports his son lives next door and is available 24/7. Pt is involved in his faith and enjoys long walks for exercise.         OT Problem List: Decreased range of motion;Impaired balance (sitting and/or standing);Decreased coordination;Decreased safety awareness;Decreased knowledge of use of DME or AE;Decreased knowledge of precautions;Impaired UE functional use      OT Treatment/Interventions: Self-care/ADL training;Therapeutic exercise;Energy conservation;DME and/or AE instruction;Therapeutic activities;Cognitive remediation/compensation;Patient/family education    OT Goals(Current goals can be found in the care plan section) Acute Rehab OT Goals Patient Stated Goal: To return to PLOF OT Goal Formulation: With  patient Time For Goal Achievement: 10/10/19 Potential to Achieve Goals: Good ADL Goals Pt Will Perform Grooming: with set-up;with caregiver independent in assisting;sitting(Independently maintaining NWBing precautions) Pt Will Perform Lower Body Dressing: with supervision;with caregiver independent in assisting;sit to/from stand Pt Will Perform Toileting - Clothing Manipulation and hygiene: with set-up;with supervision;with caregiver independent in assisting;sitting/lateral leans  OT Frequency: Min 1X/week   Barriers to D/C: Decreased caregiver support          Co-evaluation              AM-PAC OT "6 Clicks" Daily Activity     Outcome Measure Help from another person eating meals?: A Little Help from another person taking care of personal grooming?: A Little Help from another person toileting, which includes using toliet, bedpan, or urinal?: A Little Help from another person bathing (including washing, rinsing, drying)?: A Little Help from another person to put on and taking off regular upper body clothing?: A Little Help from another person to put on and taking off regular lower body clothing?: A Lot 6 Click Score: 17   End of Session    Activity Tolerance: Patient tolerated treatment well Patient left: in chair;with call  bell/phone within reach;with chair alarm set  OT Visit Diagnosis: Unsteadiness on feet (R26.81);Muscle weakness (generalized) (M62.81)                Time: 3212-2482 OT Time Calculation (min): 39 min Charges:  OT General Charges $OT Visit: 1 Visit OT Evaluation $OT Eval Moderate Complexity: 1 Mod OT Treatments $Self Care/Home Management : 23-37 mins  Kathie Dike, M.S. OTR/L  09/26/19, 11:20 AM

## 2019-09-26 NOTE — Discharge Summary (Signed)
Physician Discharge Summary  Patient ID: Joel Mcintosh MRN: [id number] DOB/AGE: 1941-06-15 79 y.o.  Admit date: 09/25/2019 Discharge date: 09/26/2019  Admission Diagnoses:  M19.011 Primary asteoarthritis left shoulder <principal problem not specified>  Discharge Diagnoses:  M19.011 Primary asteoarthritis left shoulder Active Problems:   Status post total shoulder arthroplasty, right   Past Medical History:  Diagnosis Date  . Arthritis   . Bradycardia   . Chronic kidney disease    renal insufficiency  . Hyperlipidemia   . Hypertension   . Sleep apnea    uses CPAP    Surgeries: Procedure(s): TOTAL SHOULDER ARTHROPLASTY on 09/25/2019   Consultants (if any):   Discharged Condition: Improved  Hospital Course: Joel Mcintosh is an 79 y.o. male who was admitted 09/25/2019 with a diagnosis of  M19.011 Primary asteoarthritis left shoulder <principal problem not specified> and went to the operating room on 09/25/2019 and underwent the above named procedures.    He was given perioperative antibiotics:  Anti-infectives (From admission, onward)   Start     Dose/Rate Route Frequency Ordered Stop   09/25/19 1148  50,000 units bacitracin in 0.9% normal saline 250 mL irrigation  Status:  Discontinued       As needed 09/25/19 1209 09/25/19 1418   09/25/19 0600  ceFAZolin (ANCEF) 3 g in dextrose 5 % 50 mL IVPB     3 g 100 mL/hr over 30 Minutes Intravenous On call to O.R. 09/24/19 2138 09/25/19 1130    .  He was given sequential compression devices, early ambulation, and aspirin for DVT prophylaxis.  He benefited maximally from the hospital stay and there were no complications.    Recent vital signs:  Vitals:   09/26/19 0501 09/26/19 0745  BP: (!) 149/69 (!) 145/70  Pulse: 73 66  Resp: 18 17  Temp: 98 F (36.7 C) 98.3 F (36.8 C)  SpO2: 96% 97%    Recent laboratory studies:  Lab Results  Component Value Date   HGB 13.0 09/12/2017   HGB 14.1 08/30/2017   Lab Results   Component Value Date   WBC 5.8 08/30/2017   PLT 177 08/30/2017   Lab Results  Component Value Date   INR 1.0 09/23/2019   Lab Results  Component Value Date   NA 138 09/12/2017   K 3.8 09/12/2017   CL 106 09/12/2017   CO2 25 09/12/2017   BUN 15 09/12/2017   CREATININE 0.75 09/12/2017   GLUCOSE 115 (H) 09/12/2017    Discharge Medications:   Allergies as of 09/26/2019   No Known Allergies     Medication List    TAKE these medications   amLODipine 10 MG tablet Commonly known as: NORVASC Take 10 mg by mouth at bedtime.   aspirin EC 81 MG tablet Take 81 mg by mouth daily.   docusate sodium 100 MG capsule Commonly known as: COLACE Take 1 capsule (100 mg total) by mouth 2 (two) times daily.   doxazosin 8 MG tablet Commonly known as: CARDURA Take 8 mg by mouth at bedtime.   Fish Oil 1000 MG Caps Take 1,000 mg by mouth 2 (two) times daily.   Garlic 10 MG Caps Take 10 mg by mouth daily.   HYDROcodone-acetaminophen 5-325 MG tablet Commonly known as: NORCO/VICODIN Take 1-2 tablets by mouth every 4 (four) hours as needed for up to 5 days for moderate pain (pain).   indomethacin 25 MG capsule Commonly known as: INDOCIN Take 25 mg by mouth daily as needed (gout flare).  losartan 100 MG tablet Commonly known as: COZAAR Take 100 mg by mouth daily. AM   magnesium oxide 400 MG tablet Commonly known as: MAG-OX Take 400 mg by mouth daily.   Melatonin 3 MG Tabs Take 3 mg by mouth at bedtime.   meloxicam 7.5 MG tablet Commonly known as: MOBIC Take 7.5 mg by mouth daily.   methocarbamol 500 MG tablet Commonly known as: Robaxin Take 1 tablet (500 mg total) by mouth 4 (four) times daily.   ondansetron 4 MG tablet Commonly known as: ZOFRAN Take 1 tablet (4 mg total) by mouth every 6 (six) hours as needed for nausea.   potassium chloride SA 20 MEQ tablet Commonly known as: KLOR-CON Take 20 mEq by mouth daily.       Diagnostic Studies: DG Shoulder Right  Port  Result Date: 09/25/2019 CLINICAL DATA:  Postop day 0 RIGHT shoulder arthroplasty. EXAM: PORTABLE RIGHT SHOULDER COMPARISON:  None. FINDINGS: Anatomic alignment post RIGHT shoulder arthroplasty. No acute complicating features. IMPRESSION: Anatomic alignment post RIGHT shoulder arthroplasty without acute complicating features. Electronically Signed   By: Hulan Saas M.D.   On: 09/25/2019 15:35   [place] OR NERVE BLOCK-IMAGE ONLY [facility])  Result Date: 09/25/2019 There is no interpretation for this exam.  This order is for images obtained during a surgical procedure.  Please See "Surgeries" Tab for more information regarding the procedure.    Disposition: Discharge disposition: 01-Home or Self Care      Patient will return to office in 2 weeks for staple removal.  Call the office for appointment [phone]      Signed: Altamese Cabal ,PA-C 09/26/2019, 9:24 AM

## 2019-09-26 NOTE — Progress Notes (Signed)
  Subjective:  Patient reports pain as mild.    Objective:   VITALS:   Vitals:   09/25/19 2112 09/26/19 0049 09/26/19 0501 09/26/19 0745  BP: (!) 144/74 139/71 (!) 149/69 (!) 145/70  Pulse: 72 70 73 66  Resp: 18 18 18 17   Temp: 97.8 F (36.6 C) 98.2 F (36.8 C) 98 F (36.7 C) 98.3 F (36.8 C)  TempSrc: Oral Oral Oral Oral  SpO2: 96% 95% 96% 97%  Weight:      Height:        PHYSICAL EXAM:  Neurologically intact ABD soft Neurovascular intact Sensation intact distally Intact pulses distally Dorsiflexion/Plantar flexion intact Incision: dressing C/D/I No cellulitis present Compartment soft dressing changed  LABS  No results found for this or any previous visit (from the past 24 hour(s)).  DG Shoulder Right Port  Result Date: 09/25/2019 CLINICAL DATA:  Postop day 0 RIGHT shoulder arthroplasty. EXAM: PORTABLE RIGHT SHOULDER COMPARISON:  None. FINDINGS: Anatomic alignment post RIGHT shoulder arthroplasty. No acute complicating features. IMPRESSION: Anatomic alignment post RIGHT shoulder arthroplasty without acute complicating features. Electronically Signed   By: 09/27/2019 M.D.   On: 09/25/2019 15:35   09/27/2019 OR NERVE BLOCK-IMAGE ONLY [facility])  Result Date: 09/25/2019 There is no interpretation for this exam.  This order is for images obtained during a surgical procedure.  Please See "Surgeries" Tab for more information regarding the procedure.    Assessment/Plan: 1 Day Post-Op   Active Problems:   Status post total shoulder arthroplasty, right   Advance diet Up with therapy  Discharge home today   09/27/2019 , PA-C 09/26/2019, 9:16 AM

## 2019-09-26 NOTE — Evaluation (Signed)
Physical Therapy Evaluation Patient Details Name: Joel Mcintosh MRN: [id number] DOB: May 09, 1941 Today's Date: 09/26/2019   History of Present Illness  79 year-old Male with a history of L TSA (~2 years go) and is s/p R TSA on 09/25/19.  Clinical Impression  Pt did well with all aspects of mobility and showed good safety and confidence with ambulation (~250 ft) and to a lesser degree stair negotiation.  He did need repeated cuing/reinforcement to insure safe negotiation of the steps w/o rail (pt did 4 today, has only one to enter home).  He has some swelling in L knee from recent injury (that appears to be a subacute minor meniscal injury) that led to mild limp t/o mobility tasks, but ultimately he did well and is eager to get home.  Pt's son lives next door and will be able to help him PRN, pt also reports very helpful neighbors and support network.  Educated on precautions and initial exercises to start at home before initiating outpt PT.      Follow Up Recommendations Outpatient PT;Follow surgeon's recommendation for DC plan and follow-up therapies;Supervision - Intermittent    Equipment Recommendations  None recommended by PT    Recommendations for Other Services       Precautions / Restrictions Precautions Precautions: Fall;Shoulder Type of Shoulder Precautions: total shoulder Precaution Comments: Per MD order: Sling at all times except ADL/exercise, NWB RUE, AROM R elbow, wrist, hand to tolerance, NO PROM R shoulder Required Braces or Orthoses: Sling Restrictions Weight Bearing Restrictions: Yes RUE Weight Bearing: Non weight bearing      Mobility  Bed Mobility Overal bed mobility: Needs Assistance Bed Mobility: Supine to Sit     Supine to sit: Supervision;HOB elevated     General bed mobility comments: sitting in recliner on arrival  Transfers Overall transfer level: Modified independent Equipment used: None Transfers: Sit to/from Stand Sit to Stand: Supervision          General transfer comment: Pt able to rise w/o assist, showed minor impulsivity issues but no true LOBs  Ambulation/Gait Ambulation/Gait assistance: Supervision Gait Distance (Feet): 250 Feet Assistive device: None       General Gait Details: Pt with minor limp (likely subacute meniscal injury) but no LOBs and no overt safety issues.  Pt with good confidence and stable vitals t/o the effort.   Stairs Stairs: Yes Stairs assistance: Supervision Stair Management: No rails Number of Stairs: 4 General stair comments: educated on appropriate negotiation strategy per L knee pain/injury  Wheelchair Mobility    Modified Rankin (Stroke Patients Only)       Balance Overall balance assessment: Independent                                           Pertinent Vitals/Pain Pain Assessment: No/denies pain    Home Living Family/patient expects to be discharged to:: Private residence Living Arrangements: Alone Available Help at Discharge: Family Type of Home: House Home Access: Stairs to enter Entrance Stairs-Rails: None Entrance Stairs-Number of Steps: 1 Home Layout: Two level;Able to live on main level with bedroom/bathroom Home Equipment: Bedside commode      Prior Function Level of Independence: Independent         Comments: Pt reports living alone since his wife passed in May of 2020. He was Independent c community mobility, medication management, and ADLs. He reports his son lives  next door and is available 24/7. Pt is involved in his faith and enjoys long walks for exercise.      Hand Dominance   Dominant Hand: Right    Extremity/Trunk Assessment   Upper Extremity Assessment Upper Extremity Assessment: RUE deficits/detail(in sling, L WFL per h/o shoulder) RUE Deficits / Details: s/p R TSA c MD orders to maintain NWBing    Lower Extremity Assessment Lower Extremity Assessment: Overall WFL for tasks assessed    Cervical / Trunk  Assessment Cervical / Trunk Assessment: Normal  Communication   Communication: No difficulties  Cognition Arousal/Alertness: Awake/alert Behavior During Therapy: WFL for tasks assessed/performed;Impulsive Overall Cognitive Status: Within Functional Limits for tasks assessed                                 General Comments: Pt repeatedly stated "I can do this, let me do it" during LBD.       General Comments      Exercises Other Exercises Other Exercises: Pt educated re: sling management, falls prevention, hemi-dressing techniques, importance of assistance for ADLs, safe bathing techniques Other Exercises: LBD, UBD, bed mobility, sitting balance/tolerance, sit<>stand, don/doff sling   Assessment/Plan    PT Assessment Patient needs continued PT services  PT Problem List Decreased strength;Decreased range of motion;Decreased activity tolerance;Decreased balance;Decreased mobility;Decreased knowledge of use of DME;Decreased safety awareness;Pain       PT Treatment Interventions Gait training;Stair training;Therapeutic activities;Therapeutic exercise;Functional mobility training;Balance training;Patient/family education;Wheelchair mobility training;Neuromuscular re-education    PT Goals (Current goals can be found in the Care Plan section)  Acute Rehab PT Goals Patient Stated Goal: To return to PLOF PT Goal Formulation: With patient Time For Goal Achievement: 10/10/19 Potential to Achieve Goals: Good    Frequency BID   Barriers to discharge        Co-evaluation               AM-PAC PT "6 Clicks" Mobility  Outcome Measure Help needed turning from your back to your side while in a flat bed without using bedrails?: None Help needed moving from lying on your back to sitting on the side of a flat bed without using bedrails?: None Help needed moving to and from a bed to a chair (including a wheelchair)?: None Help needed standing up from a chair using your  arms (e.g., wheelchair or bedside chair)?: None Help needed to walk in hospital room?: None Help needed climbing 3-5 steps with a railing? : A Little 6 Click Score: 23    End of Session Equipment Utilized During Treatment: Gait belt Activity Tolerance: Patient tolerated treatment well Patient left: with chair alarm set;with call bell/phone within reach Nurse Communication: Mobility status PT Visit Diagnosis: Muscle weakness (generalized) (M62.81);Pain Pain - Right/Left: Right Pain - part of body: Shoulder    Time: 5638-9373 PT Time Calculation (min) (ACUTE ONLY): 25 min   Charges:   PT Evaluation $PT Eval Low Complexity: 1 Low PT Treatments $Gait Training: 8-22 mins        Kreg Shropshire, DPT 09/26/2019, 12:23 PM

## 2019-09-26 NOTE — Discharge Instructions (Signed)
No tight elastic waist bands over the bandage. Not wearing underwear is preferred, or pull waist band above the bandage.  May shower with bandage in place.  If bandage becomes saturated, OK to removal and place band-aid.  Pain medication can cause constipation.  You should increase your fluid intake, increase your intake of high fiber foods and/or take Metamucil as needed for constipation.   Continue your physical therapy exercises at least twice daily.  It is a good idea to use an ice pack for 30 minutes after doing your exercises to reduce swelling.  Do not be surprised if you have increased pain at night.  This usually means you have been a little too active during the day and need to reduce your activities.   Call with questions, fever>101.5 degrees, shortness of breath or drainage from the wound.   Patient may shower. No bath or submerging the wound.    Take Aspirin as directed for blood clot prevention.  Continue to work on knee range of motion exercises at home as instructed by physical therapy. Call [phone] with any questions, such as fever > 101.5 degrees, drainage from the wound or shortness of breath.

## 2019-09-27 LAB — SURGICAL PATHOLOGY

## 2019-09-30 DIAGNOSIS — M19011 Primary osteoarthritis, right shoulder: Secondary | ICD-10-CM | POA: Diagnosis not present

## 2019-09-30 DIAGNOSIS — Z471 Aftercare following joint replacement surgery: Secondary | ICD-10-CM | POA: Diagnosis not present

## 2019-09-30 DIAGNOSIS — Z96611 Presence of right artificial shoulder joint: Secondary | ICD-10-CM | POA: Diagnosis not present

## 2019-10-02 DIAGNOSIS — Z471 Aftercare following joint replacement surgery: Secondary | ICD-10-CM | POA: Diagnosis not present

## 2019-10-02 DIAGNOSIS — Z96611 Presence of right artificial shoulder joint: Secondary | ICD-10-CM | POA: Diagnosis not present

## 2019-10-02 DIAGNOSIS — M19011 Primary osteoarthritis, right shoulder: Secondary | ICD-10-CM | POA: Diagnosis not present

## 2019-10-04 DIAGNOSIS — M19011 Primary osteoarthritis, right shoulder: Secondary | ICD-10-CM | POA: Diagnosis not present

## 2019-10-04 DIAGNOSIS — Z471 Aftercare following joint replacement surgery: Secondary | ICD-10-CM | POA: Diagnosis not present

## 2019-10-04 DIAGNOSIS — Z96611 Presence of right artificial shoulder joint: Secondary | ICD-10-CM | POA: Diagnosis not present

## 2019-10-07 DIAGNOSIS — Z96611 Presence of right artificial shoulder joint: Secondary | ICD-10-CM | POA: Diagnosis not present

## 2019-10-07 DIAGNOSIS — M19011 Primary osteoarthritis, right shoulder: Secondary | ICD-10-CM | POA: Diagnosis not present

## 2019-10-07 DIAGNOSIS — Z471 Aftercare following joint replacement surgery: Secondary | ICD-10-CM | POA: Diagnosis not present

## 2019-10-07 DIAGNOSIS — Z96612 Presence of left artificial shoulder joint: Secondary | ICD-10-CM | POA: Diagnosis not present

## 2019-10-07 DIAGNOSIS — M25562 Pain in left knee: Secondary | ICD-10-CM | POA: Diagnosis not present

## 2019-10-09 DIAGNOSIS — Z471 Aftercare following joint replacement surgery: Secondary | ICD-10-CM | POA: Diagnosis not present

## 2019-10-09 DIAGNOSIS — Z96611 Presence of right artificial shoulder joint: Secondary | ICD-10-CM | POA: Diagnosis not present

## 2019-10-09 DIAGNOSIS — M19011 Primary osteoarthritis, right shoulder: Secondary | ICD-10-CM | POA: Diagnosis not present

## 2019-10-11 DIAGNOSIS — Z471 Aftercare following joint replacement surgery: Secondary | ICD-10-CM | POA: Diagnosis not present

## 2019-10-11 DIAGNOSIS — M19011 Primary osteoarthritis, right shoulder: Secondary | ICD-10-CM | POA: Diagnosis not present

## 2019-10-11 DIAGNOSIS — Z96611 Presence of right artificial shoulder joint: Secondary | ICD-10-CM | POA: Diagnosis not present

## 2019-10-14 DIAGNOSIS — Z96611 Presence of right artificial shoulder joint: Secondary | ICD-10-CM | POA: Diagnosis not present

## 2019-10-14 DIAGNOSIS — Z471 Aftercare following joint replacement surgery: Secondary | ICD-10-CM | POA: Diagnosis not present

## 2019-10-14 DIAGNOSIS — M19011 Primary osteoarthritis, right shoulder: Secondary | ICD-10-CM | POA: Diagnosis not present

## 2019-10-16 DIAGNOSIS — Z96611 Presence of right artificial shoulder joint: Secondary | ICD-10-CM | POA: Diagnosis not present

## 2019-10-16 DIAGNOSIS — Z471 Aftercare following joint replacement surgery: Secondary | ICD-10-CM | POA: Diagnosis not present

## 2019-10-16 DIAGNOSIS — M19011 Primary osteoarthritis, right shoulder: Secondary | ICD-10-CM | POA: Diagnosis not present

## 2019-10-18 DIAGNOSIS — Z471 Aftercare following joint replacement surgery: Secondary | ICD-10-CM | POA: Diagnosis not present

## 2019-10-18 DIAGNOSIS — Z96611 Presence of right artificial shoulder joint: Secondary | ICD-10-CM | POA: Diagnosis not present

## 2019-10-18 DIAGNOSIS — M19011 Primary osteoarthritis, right shoulder: Secondary | ICD-10-CM | POA: Diagnosis not present

## 2019-10-21 DIAGNOSIS — M19011 Primary osteoarthritis, right shoulder: Secondary | ICD-10-CM | POA: Diagnosis not present

## 2019-10-21 DIAGNOSIS — Z471 Aftercare following joint replacement surgery: Secondary | ICD-10-CM | POA: Diagnosis not present

## 2019-10-21 DIAGNOSIS — Z96611 Presence of right artificial shoulder joint: Secondary | ICD-10-CM | POA: Diagnosis not present

## 2019-10-22 DIAGNOSIS — Z6825 Body mass index (BMI) 25.0-25.9, adult: Secondary | ICD-10-CM | POA: Diagnosis not present

## 2019-10-22 DIAGNOSIS — M1712 Unilateral primary osteoarthritis, left knee: Secondary | ICD-10-CM | POA: Diagnosis not present

## 2019-10-22 DIAGNOSIS — M25562 Pain in left knee: Secondary | ICD-10-CM | POA: Diagnosis not present

## 2019-10-22 DIAGNOSIS — R609 Edema, unspecified: Secondary | ICD-10-CM | POA: Diagnosis not present

## 2019-10-22 DIAGNOSIS — T1490XA Injury, unspecified, initial encounter: Secondary | ICD-10-CM | POA: Diagnosis not present

## 2019-10-23 DIAGNOSIS — Z471 Aftercare following joint replacement surgery: Secondary | ICD-10-CM | POA: Diagnosis not present

## 2019-10-23 DIAGNOSIS — Z96611 Presence of right artificial shoulder joint: Secondary | ICD-10-CM | POA: Diagnosis not present

## 2019-10-23 DIAGNOSIS — M19011 Primary osteoarthritis, right shoulder: Secondary | ICD-10-CM | POA: Diagnosis not present

## 2019-10-25 DIAGNOSIS — M19011 Primary osteoarthritis, right shoulder: Secondary | ICD-10-CM | POA: Diagnosis not present

## 2019-10-25 DIAGNOSIS — Z471 Aftercare following joint replacement surgery: Secondary | ICD-10-CM | POA: Diagnosis not present

## 2019-10-25 DIAGNOSIS — Z96611 Presence of right artificial shoulder joint: Secondary | ICD-10-CM | POA: Diagnosis not present

## 2019-10-28 DIAGNOSIS — M25562 Pain in left knee: Secondary | ICD-10-CM | POA: Diagnosis not present

## 2019-10-28 DIAGNOSIS — Z96611 Presence of right artificial shoulder joint: Secondary | ICD-10-CM | POA: Diagnosis not present

## 2019-10-28 DIAGNOSIS — Z96612 Presence of left artificial shoulder joint: Secondary | ICD-10-CM | POA: Diagnosis not present

## 2019-10-30 DIAGNOSIS — Z471 Aftercare following joint replacement surgery: Secondary | ICD-10-CM | POA: Diagnosis not present

## 2019-10-30 DIAGNOSIS — Z96611 Presence of right artificial shoulder joint: Secondary | ICD-10-CM | POA: Diagnosis not present

## 2019-10-30 DIAGNOSIS — M19011 Primary osteoarthritis, right shoulder: Secondary | ICD-10-CM | POA: Diagnosis not present

## 2019-10-31 DIAGNOSIS — M25562 Pain in left knee: Secondary | ICD-10-CM | POA: Diagnosis not present

## 2019-10-31 DIAGNOSIS — M94262 Chondromalacia, left knee: Secondary | ICD-10-CM | POA: Diagnosis not present

## 2019-11-01 DIAGNOSIS — Z96611 Presence of right artificial shoulder joint: Secondary | ICD-10-CM | POA: Diagnosis not present

## 2019-11-01 DIAGNOSIS — Z471 Aftercare following joint replacement surgery: Secondary | ICD-10-CM | POA: Diagnosis not present

## 2019-11-01 DIAGNOSIS — M19011 Primary osteoarthritis, right shoulder: Secondary | ICD-10-CM | POA: Diagnosis not present

## 2019-11-04 DIAGNOSIS — Z471 Aftercare following joint replacement surgery: Secondary | ICD-10-CM | POA: Diagnosis not present

## 2019-11-04 DIAGNOSIS — M19011 Primary osteoarthritis, right shoulder: Secondary | ICD-10-CM | POA: Diagnosis not present

## 2019-11-04 DIAGNOSIS — Z96611 Presence of right artificial shoulder joint: Secondary | ICD-10-CM | POA: Diagnosis not present

## 2019-11-08 DIAGNOSIS — Z471 Aftercare following joint replacement surgery: Secondary | ICD-10-CM | POA: Diagnosis not present

## 2019-11-08 DIAGNOSIS — Z96611 Presence of right artificial shoulder joint: Secondary | ICD-10-CM | POA: Diagnosis not present

## 2019-11-08 DIAGNOSIS — M19011 Primary osteoarthritis, right shoulder: Secondary | ICD-10-CM | POA: Diagnosis not present

## 2019-11-11 DIAGNOSIS — Z96611 Presence of right artificial shoulder joint: Secondary | ICD-10-CM | POA: Diagnosis not present

## 2019-11-11 DIAGNOSIS — Z471 Aftercare following joint replacement surgery: Secondary | ICD-10-CM | POA: Diagnosis not present

## 2019-11-11 DIAGNOSIS — M19011 Primary osteoarthritis, right shoulder: Secondary | ICD-10-CM | POA: Diagnosis not present

## 2019-11-15 DIAGNOSIS — Z471 Aftercare following joint replacement surgery: Secondary | ICD-10-CM | POA: Diagnosis not present

## 2019-11-15 DIAGNOSIS — Z96611 Presence of right artificial shoulder joint: Secondary | ICD-10-CM | POA: Diagnosis not present

## 2019-11-15 DIAGNOSIS — M19011 Primary osteoarthritis, right shoulder: Secondary | ICD-10-CM | POA: Diagnosis not present

## 2019-11-18 DIAGNOSIS — M19011 Primary osteoarthritis, right shoulder: Secondary | ICD-10-CM | POA: Diagnosis not present

## 2019-11-18 DIAGNOSIS — Z471 Aftercare following joint replacement surgery: Secondary | ICD-10-CM | POA: Diagnosis not present

## 2019-11-18 DIAGNOSIS — Z96611 Presence of right artificial shoulder joint: Secondary | ICD-10-CM | POA: Diagnosis not present

## 2019-11-22 DIAGNOSIS — Z471 Aftercare following joint replacement surgery: Secondary | ICD-10-CM | POA: Diagnosis not present

## 2019-11-22 DIAGNOSIS — M19011 Primary osteoarthritis, right shoulder: Secondary | ICD-10-CM | POA: Diagnosis not present

## 2019-11-22 DIAGNOSIS — Z96611 Presence of right artificial shoulder joint: Secondary | ICD-10-CM | POA: Diagnosis not present

## 2019-11-25 DIAGNOSIS — Z96611 Presence of right artificial shoulder joint: Secondary | ICD-10-CM | POA: Diagnosis not present

## 2019-11-25 DIAGNOSIS — Z471 Aftercare following joint replacement surgery: Secondary | ICD-10-CM | POA: Diagnosis not present

## 2019-11-25 DIAGNOSIS — M19011 Primary osteoarthritis, right shoulder: Secondary | ICD-10-CM | POA: Diagnosis not present

## 2019-11-29 DIAGNOSIS — M19011 Primary osteoarthritis, right shoulder: Secondary | ICD-10-CM | POA: Diagnosis not present

## 2019-11-29 DIAGNOSIS — Z96611 Presence of right artificial shoulder joint: Secondary | ICD-10-CM | POA: Diagnosis not present

## 2019-11-29 DIAGNOSIS — Z471 Aftercare following joint replacement surgery: Secondary | ICD-10-CM | POA: Diagnosis not present

## 2019-12-06 DIAGNOSIS — M19011 Primary osteoarthritis, right shoulder: Secondary | ICD-10-CM | POA: Diagnosis not present

## 2019-12-06 DIAGNOSIS — Z471 Aftercare following joint replacement surgery: Secondary | ICD-10-CM | POA: Diagnosis not present

## 2019-12-06 DIAGNOSIS — Z96611 Presence of right artificial shoulder joint: Secondary | ICD-10-CM | POA: Diagnosis not present

## 2019-12-16 DIAGNOSIS — Z6825 Body mass index (BMI) 25.0-25.9, adult: Secondary | ICD-10-CM | POA: Diagnosis not present

## 2019-12-16 DIAGNOSIS — R351 Nocturia: Secondary | ICD-10-CM | POA: Diagnosis not present

## 2019-12-16 DIAGNOSIS — M25562 Pain in left knee: Secondary | ICD-10-CM | POA: Diagnosis not present

## 2019-12-16 DIAGNOSIS — E785 Hyperlipidemia, unspecified: Secondary | ICD-10-CM | POA: Diagnosis not present

## 2019-12-16 DIAGNOSIS — I10 Essential (primary) hypertension: Secondary | ICD-10-CM | POA: Diagnosis not present

## 2019-12-26 DIAGNOSIS — M109 Gout, unspecified: Secondary | ICD-10-CM | POA: Diagnosis not present

## 2019-12-26 DIAGNOSIS — I10 Essential (primary) hypertension: Secondary | ICD-10-CM | POA: Diagnosis not present

## 2019-12-26 DIAGNOSIS — E876 Hypokalemia: Secondary | ICD-10-CM | POA: Diagnosis not present

## 2019-12-26 DIAGNOSIS — E7800 Pure hypercholesterolemia, unspecified: Secondary | ICD-10-CM | POA: Diagnosis not present

## 2019-12-30 DIAGNOSIS — M1712 Unilateral primary osteoarthritis, left knee: Secondary | ICD-10-CM | POA: Diagnosis not present

## 2020-01-07 DIAGNOSIS — M1712 Unilateral primary osteoarthritis, left knee: Secondary | ICD-10-CM | POA: Diagnosis not present

## 2020-01-08 DIAGNOSIS — I10 Essential (primary) hypertension: Secondary | ICD-10-CM | POA: Diagnosis not present

## 2020-01-08 DIAGNOSIS — Z6824 Body mass index (BMI) 24.0-24.9, adult: Secondary | ICD-10-CM | POA: Diagnosis not present

## 2020-01-08 DIAGNOSIS — R351 Nocturia: Secondary | ICD-10-CM | POA: Diagnosis not present

## 2020-01-08 DIAGNOSIS — R252 Cramp and spasm: Secondary | ICD-10-CM | POA: Diagnosis not present

## 2020-01-13 DIAGNOSIS — M1712 Unilateral primary osteoarthritis, left knee: Secondary | ICD-10-CM | POA: Diagnosis not present

## 2020-04-09 DIAGNOSIS — I10 Essential (primary) hypertension: Secondary | ICD-10-CM | POA: Diagnosis not present

## 2020-04-09 DIAGNOSIS — G8929 Other chronic pain: Secondary | ICD-10-CM | POA: Diagnosis not present

## 2020-04-09 DIAGNOSIS — M25562 Pain in left knee: Secondary | ICD-10-CM | POA: Diagnosis not present

## 2020-04-09 DIAGNOSIS — E785 Hyperlipidemia, unspecified: Secondary | ICD-10-CM | POA: Diagnosis not present

## 2020-04-09 DIAGNOSIS — Z6825 Body mass index (BMI) 25.0-25.9, adult: Secondary | ICD-10-CM | POA: Diagnosis not present

## 2020-04-09 DIAGNOSIS — R351 Nocturia: Secondary | ICD-10-CM | POA: Diagnosis not present

## 2020-05-12 DIAGNOSIS — Z7189 Other specified counseling: Secondary | ICD-10-CM | POA: Diagnosis not present

## 2020-05-12 DIAGNOSIS — Z23 Encounter for immunization: Secondary | ICD-10-CM | POA: Diagnosis not present

## 2020-05-18 DIAGNOSIS — M1712 Unilateral primary osteoarthritis, left knee: Secondary | ICD-10-CM | POA: Diagnosis not present

## 2020-05-26 DIAGNOSIS — N529 Male erectile dysfunction, unspecified: Secondary | ICD-10-CM | POA: Diagnosis not present

## 2020-05-26 DIAGNOSIS — E7800 Pure hypercholesterolemia, unspecified: Secondary | ICD-10-CM | POA: Diagnosis not present

## 2020-05-26 DIAGNOSIS — I10 Essential (primary) hypertension: Secondary | ICD-10-CM | POA: Diagnosis not present

## 2020-05-26 DIAGNOSIS — G4733 Obstructive sleep apnea (adult) (pediatric): Secondary | ICD-10-CM | POA: Diagnosis not present

## 2020-05-26 DIAGNOSIS — Z6825 Body mass index (BMI) 25.0-25.9, adult: Secondary | ICD-10-CM | POA: Diagnosis not present

## 2020-05-26 DIAGNOSIS — Z01818 Encounter for other preprocedural examination: Secondary | ICD-10-CM | POA: Diagnosis not present

## 2020-05-26 DIAGNOSIS — Z789 Other specified health status: Secondary | ICD-10-CM | POA: Diagnosis not present

## 2020-05-26 DIAGNOSIS — M1712 Unilateral primary osteoarthritis, left knee: Secondary | ICD-10-CM | POA: Diagnosis not present

## 2020-06-15 DIAGNOSIS — Z01818 Encounter for other preprocedural examination: Secondary | ICD-10-CM | POA: Diagnosis not present

## 2020-06-18 DIAGNOSIS — M25562 Pain in left knee: Secondary | ICD-10-CM | POA: Diagnosis not present

## 2020-06-18 DIAGNOSIS — Z7982 Long term (current) use of aspirin: Secondary | ICD-10-CM | POA: Diagnosis not present

## 2020-06-18 DIAGNOSIS — Z79899 Other long term (current) drug therapy: Secondary | ICD-10-CM | POA: Diagnosis not present

## 2020-06-18 DIAGNOSIS — M1712 Unilateral primary osteoarthritis, left knee: Secondary | ICD-10-CM | POA: Diagnosis not present

## 2020-06-18 DIAGNOSIS — M25762 Osteophyte, left knee: Secondary | ICD-10-CM | POA: Diagnosis not present

## 2020-06-18 DIAGNOSIS — E876 Hypokalemia: Secondary | ICD-10-CM | POA: Diagnosis not present

## 2020-06-18 DIAGNOSIS — G8918 Other acute postprocedural pain: Secondary | ICD-10-CM | POA: Diagnosis not present

## 2020-06-18 DIAGNOSIS — N400 Enlarged prostate without lower urinary tract symptoms: Secondary | ICD-10-CM | POA: Diagnosis not present

## 2020-06-18 DIAGNOSIS — G4733 Obstructive sleep apnea (adult) (pediatric): Secondary | ICD-10-CM | POA: Diagnosis not present

## 2020-06-18 DIAGNOSIS — Z96619 Presence of unspecified artificial shoulder joint: Secondary | ICD-10-CM | POA: Diagnosis not present

## 2020-06-18 DIAGNOSIS — I10 Essential (primary) hypertension: Secondary | ICD-10-CM | POA: Diagnosis not present

## 2020-06-18 DIAGNOSIS — Z87891 Personal history of nicotine dependence: Secondary | ICD-10-CM | POA: Diagnosis not present

## 2020-06-19 DIAGNOSIS — M1712 Unilateral primary osteoarthritis, left knee: Secondary | ICD-10-CM | POA: Diagnosis not present

## 2020-06-19 DIAGNOSIS — Z7982 Long term (current) use of aspirin: Secondary | ICD-10-CM | POA: Diagnosis not present

## 2020-06-19 DIAGNOSIS — G4733 Obstructive sleep apnea (adult) (pediatric): Secondary | ICD-10-CM | POA: Diagnosis not present

## 2020-06-19 DIAGNOSIS — Z79899 Other long term (current) drug therapy: Secondary | ICD-10-CM | POA: Diagnosis not present

## 2020-06-19 DIAGNOSIS — I10 Essential (primary) hypertension: Secondary | ICD-10-CM | POA: Diagnosis not present

## 2020-06-19 DIAGNOSIS — M25762 Osteophyte, left knee: Secondary | ICD-10-CM | POA: Diagnosis not present

## 2020-06-24 DIAGNOSIS — M25511 Pain in right shoulder: Secondary | ICD-10-CM | POA: Diagnosis not present

## 2020-06-24 DIAGNOSIS — G8929 Other chronic pain: Secondary | ICD-10-CM | POA: Diagnosis not present

## 2020-06-24 DIAGNOSIS — Z96652 Presence of left artificial knee joint: Secondary | ICD-10-CM | POA: Diagnosis not present

## 2020-06-29 ENCOUNTER — Other Ambulatory Visit: Payer: Self-pay

## 2020-06-29 ENCOUNTER — Other Ambulatory Visit: Payer: Self-pay | Admitting: Orthopedic Surgery

## 2020-06-29 ENCOUNTER — Ambulatory Visit
Admission: RE | Admit: 2020-06-29 | Discharge: 2020-06-29 | Disposition: A | Discharge status: Home / Self Care | Payer: Medicare Other | Source: Ambulatory Visit | Attending: Orthopedic Surgery | Admitting: Orthopedic Surgery

## 2020-06-29 DIAGNOSIS — R2242 Localized swelling, mass and lump, left lower limb: Secondary | ICD-10-CM

## 2020-06-29 DIAGNOSIS — R600 Localized edema: Secondary | ICD-10-CM | POA: Diagnosis not present

## 2020-06-30 DIAGNOSIS — E7800 Pure hypercholesterolemia, unspecified: Secondary | ICD-10-CM | POA: Diagnosis not present

## 2020-06-30 DIAGNOSIS — Z9181 History of falling: Secondary | ICD-10-CM | POA: Diagnosis not present

## 2020-06-30 DIAGNOSIS — M109 Gout, unspecified: Secondary | ICD-10-CM | POA: Diagnosis not present

## 2020-06-30 DIAGNOSIS — N529 Male erectile dysfunction, unspecified: Secondary | ICD-10-CM | POA: Diagnosis not present

## 2020-06-30 DIAGNOSIS — Z6825 Body mass index (BMI) 25.0-25.9, adult: Secondary | ICD-10-CM | POA: Diagnosis not present

## 2020-06-30 DIAGNOSIS — R252 Cramp and spasm: Secondary | ICD-10-CM | POA: Diagnosis not present

## 2020-06-30 DIAGNOSIS — I10 Essential (primary) hypertension: Secondary | ICD-10-CM | POA: Diagnosis not present

## 2020-06-30 DIAGNOSIS — Z1331 Encounter for screening for depression: Secondary | ICD-10-CM | POA: Diagnosis not present

## 2020-06-30 DIAGNOSIS — E876 Hypokalemia: Secondary | ICD-10-CM | POA: Diagnosis not present

## 2020-07-01 DIAGNOSIS — G8929 Other chronic pain: Secondary | ICD-10-CM | POA: Diagnosis not present

## 2020-07-01 DIAGNOSIS — Z96652 Presence of left artificial knee joint: Secondary | ICD-10-CM | POA: Diagnosis not present

## 2020-07-01 DIAGNOSIS — M25511 Pain in right shoulder: Secondary | ICD-10-CM | POA: Diagnosis not present

## 2020-07-06 DIAGNOSIS — Z96652 Presence of left artificial knee joint: Secondary | ICD-10-CM | POA: Diagnosis not present

## 2020-07-06 DIAGNOSIS — G8929 Other chronic pain: Secondary | ICD-10-CM | POA: Diagnosis not present

## 2020-07-06 DIAGNOSIS — M25511 Pain in right shoulder: Secondary | ICD-10-CM | POA: Diagnosis not present

## 2020-07-08 DIAGNOSIS — M25511 Pain in right shoulder: Secondary | ICD-10-CM | POA: Diagnosis not present

## 2020-07-08 DIAGNOSIS — G8929 Other chronic pain: Secondary | ICD-10-CM | POA: Diagnosis not present

## 2020-07-08 DIAGNOSIS — Z96652 Presence of left artificial knee joint: Secondary | ICD-10-CM | POA: Diagnosis not present

## 2020-07-13 DIAGNOSIS — E785 Hyperlipidemia, unspecified: Secondary | ICD-10-CM | POA: Diagnosis not present

## 2020-07-13 DIAGNOSIS — R351 Nocturia: Secondary | ICD-10-CM | POA: Diagnosis not present

## 2020-07-13 DIAGNOSIS — I10 Essential (primary) hypertension: Secondary | ICD-10-CM | POA: Diagnosis not present

## 2020-07-13 DIAGNOSIS — G8929 Other chronic pain: Secondary | ICD-10-CM | POA: Diagnosis not present

## 2020-07-13 DIAGNOSIS — M25562 Pain in left knee: Secondary | ICD-10-CM | POA: Diagnosis not present

## 2020-07-13 DIAGNOSIS — Z6825 Body mass index (BMI) 25.0-25.9, adult: Secondary | ICD-10-CM | POA: Diagnosis not present

## 2020-07-15 DIAGNOSIS — Z96652 Presence of left artificial knee joint: Secondary | ICD-10-CM | POA: Diagnosis not present

## 2020-07-15 DIAGNOSIS — M25511 Pain in right shoulder: Secondary | ICD-10-CM | POA: Diagnosis not present

## 2020-07-15 DIAGNOSIS — G8929 Other chronic pain: Secondary | ICD-10-CM | POA: Diagnosis not present

## 2020-07-20 DIAGNOSIS — Z96652 Presence of left artificial knee joint: Secondary | ICD-10-CM | POA: Diagnosis not present

## 2020-07-20 DIAGNOSIS — M25511 Pain in right shoulder: Secondary | ICD-10-CM | POA: Diagnosis not present

## 2020-07-20 DIAGNOSIS — G8929 Other chronic pain: Secondary | ICD-10-CM | POA: Diagnosis not present

## 2020-07-22 DIAGNOSIS — G8929 Other chronic pain: Secondary | ICD-10-CM | POA: Diagnosis not present

## 2020-07-22 DIAGNOSIS — Z96652 Presence of left artificial knee joint: Secondary | ICD-10-CM | POA: Diagnosis not present

## 2020-07-22 DIAGNOSIS — M25511 Pain in right shoulder: Secondary | ICD-10-CM | POA: Diagnosis not present

## 2020-07-27 DIAGNOSIS — M25511 Pain in right shoulder: Secondary | ICD-10-CM | POA: Diagnosis not present

## 2020-07-27 DIAGNOSIS — Z96652 Presence of left artificial knee joint: Secondary | ICD-10-CM | POA: Diagnosis not present

## 2020-07-27 DIAGNOSIS — G8929 Other chronic pain: Secondary | ICD-10-CM | POA: Diagnosis not present

## 2020-07-28 DIAGNOSIS — Z96652 Presence of left artificial knee joint: Secondary | ICD-10-CM | POA: Diagnosis not present

## 2020-07-28 DIAGNOSIS — Z96611 Presence of right artificial shoulder joint: Secondary | ICD-10-CM | POA: Diagnosis not present

## 2020-07-29 DIAGNOSIS — Z96652 Presence of left artificial knee joint: Secondary | ICD-10-CM | POA: Diagnosis not present

## 2020-07-29 DIAGNOSIS — M25511 Pain in right shoulder: Secondary | ICD-10-CM | POA: Diagnosis not present

## 2020-07-29 DIAGNOSIS — G8929 Other chronic pain: Secondary | ICD-10-CM | POA: Diagnosis not present

## 2020-08-03 DIAGNOSIS — M25511 Pain in right shoulder: Secondary | ICD-10-CM | POA: Diagnosis not present

## 2020-08-03 DIAGNOSIS — Z96652 Presence of left artificial knee joint: Secondary | ICD-10-CM | POA: Diagnosis not present

## 2020-08-03 DIAGNOSIS — G8929 Other chronic pain: Secondary | ICD-10-CM | POA: Diagnosis not present

## 2020-08-06 DIAGNOSIS — M25511 Pain in right shoulder: Secondary | ICD-10-CM | POA: Diagnosis not present

## 2020-08-06 DIAGNOSIS — G8929 Other chronic pain: Secondary | ICD-10-CM | POA: Diagnosis not present

## 2020-08-06 DIAGNOSIS — Z96652 Presence of left artificial knee joint: Secondary | ICD-10-CM | POA: Diagnosis not present

## 2020-08-28 DIAGNOSIS — U071 COVID-19: Secondary | ICD-10-CM

## 2020-08-28 HISTORY — DX: COVID-19: U07.1

## 2021-03-24 IMAGING — US US EXTREM LOW VENOUS*L*
1 series · 13 of 24 positions shown · non-contrast
Comparison: None.

CLINICAL DATA: Left lower extremity edema. History of left knee
surgery on 06/18/2020. Evaluate for DVT.



[Series 1: us venous img lower uni left (dvt) · portal-venous · 13 of 33 slices shown]
[im 1/33]
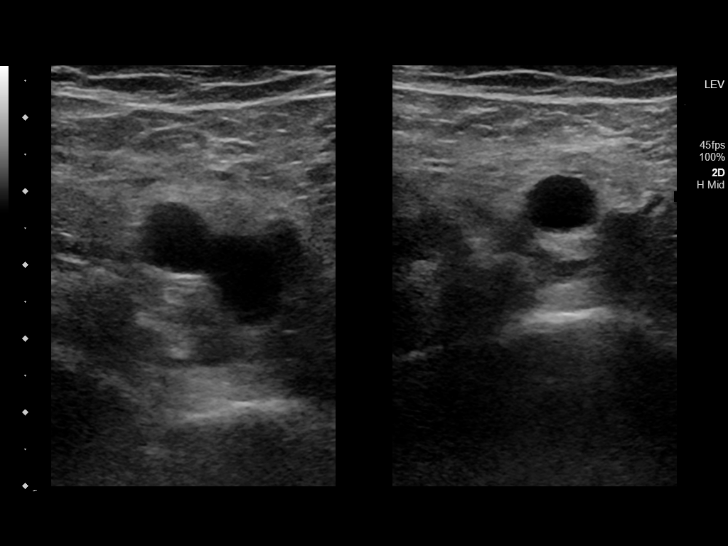
[im 3/33]
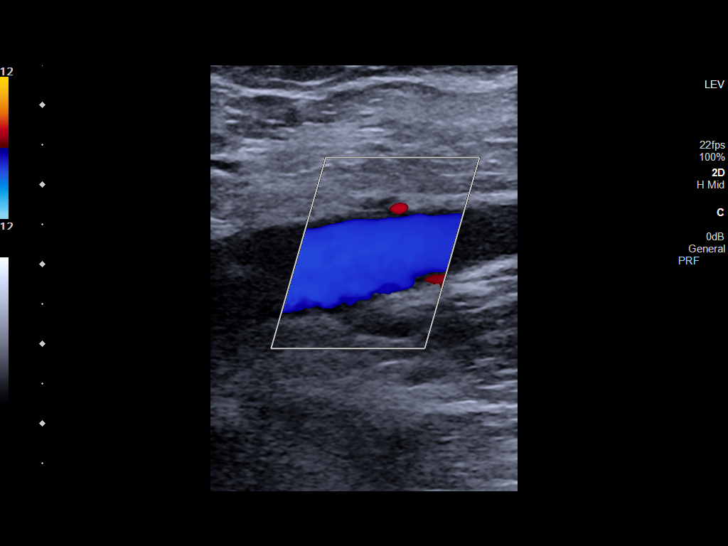
[im 6/33]
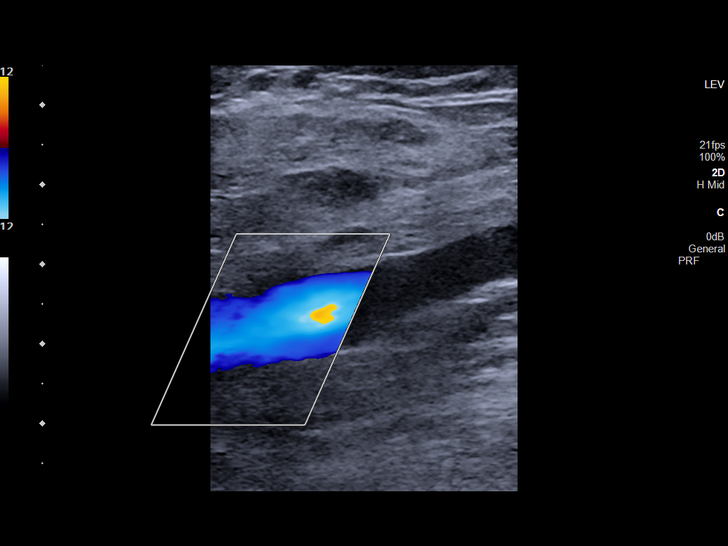
[im 9/33]
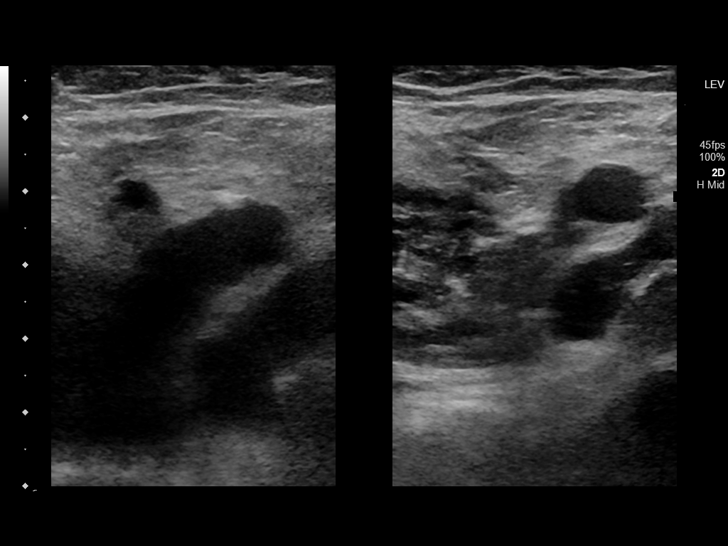
[im 12/33]
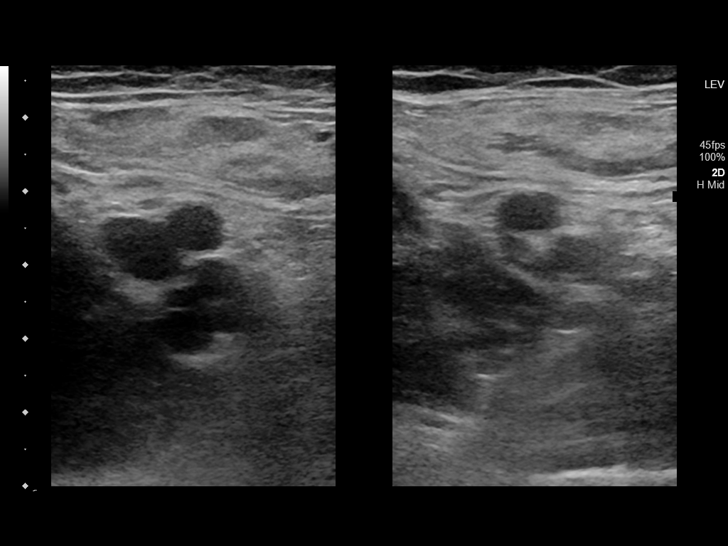
[im 14/33]
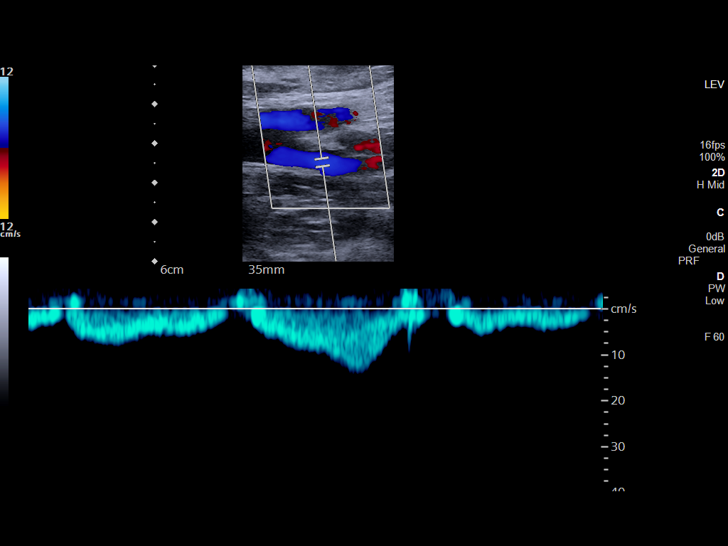
[im 17/33]
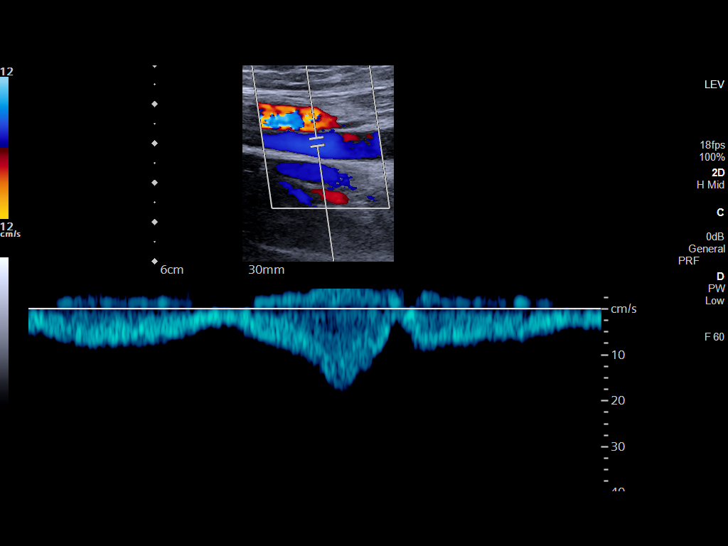
[im 19/33]
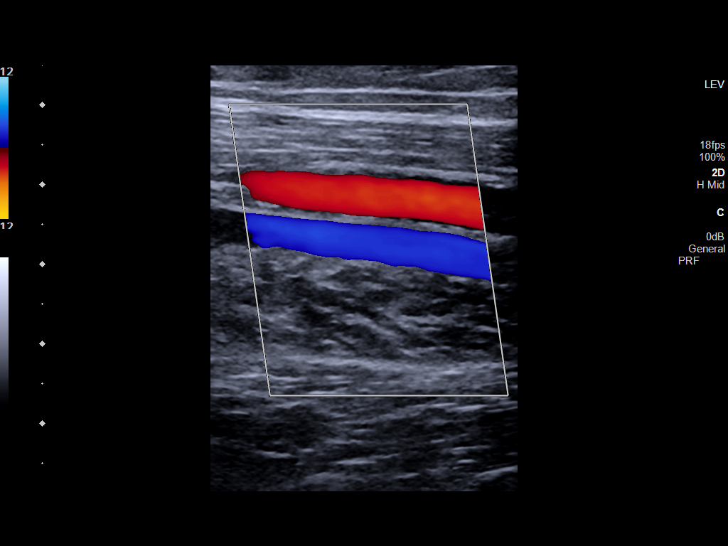
[im 21/33]
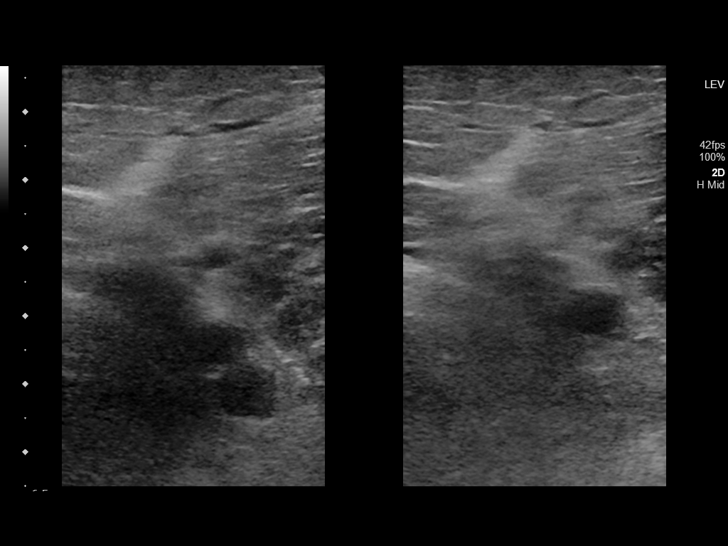
[im 24/33]
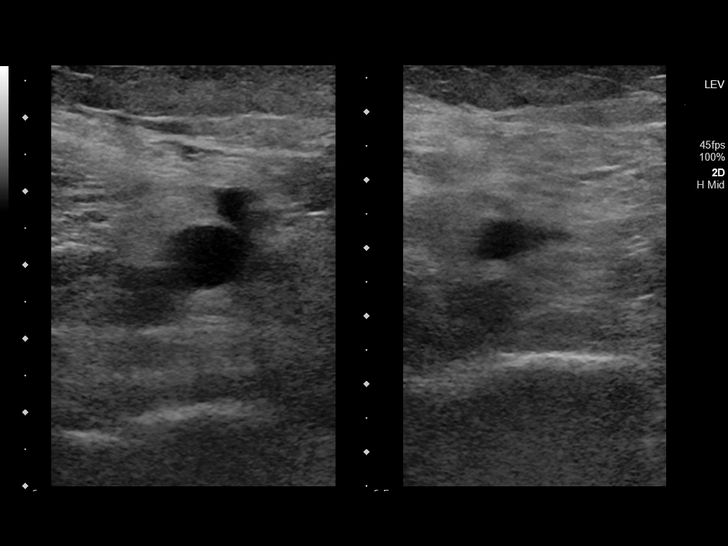
[im 27/33]
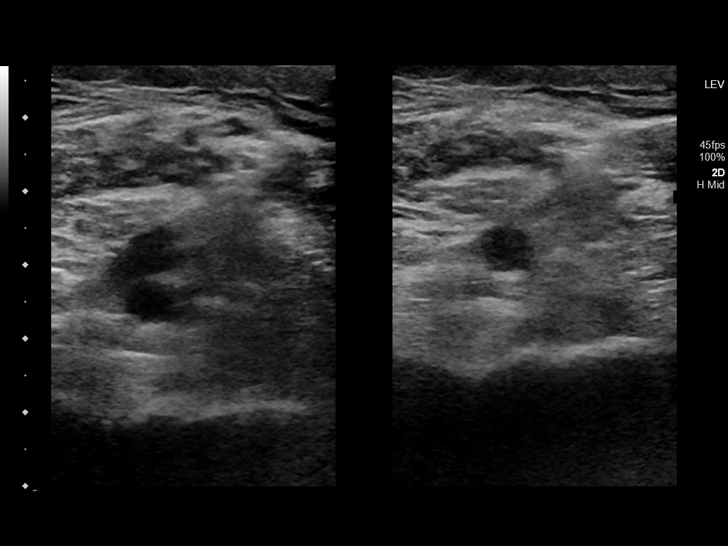
[im 30/33]
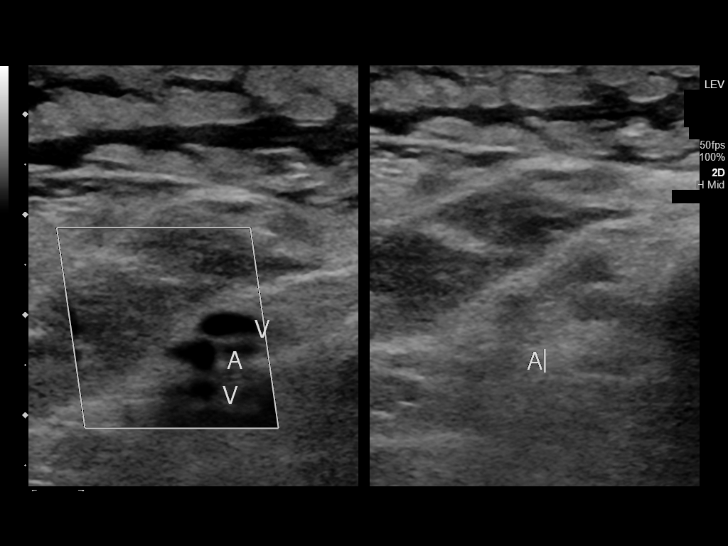
[im 33/33]
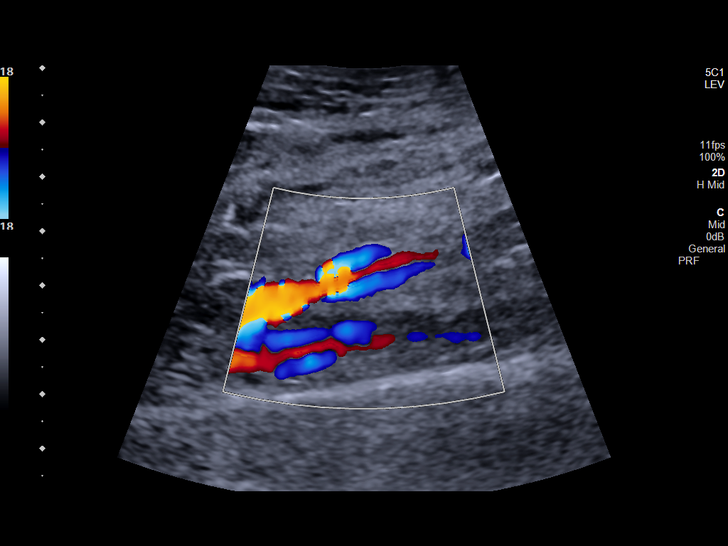

[13 of 24 positions shown; findings below may reference images not displayed]

FINDINGS: Contralateral Common Femoral Vein: Respiratory phasicity is normal
and symmetric with the symptomatic side. No evidence of thrombus.
Normal compressibility.

Common Femoral Vein: No evidence of thrombus. Normal
compressibility, respiratory phasicity and response to augmentation.

Saphenofemoral Junction: No evidence of thrombus. Normal
compressibility and flow on color Doppler imaging.

Profunda Femoral Vein: No evidence of thrombus. Normal
compressibility and flow on color Doppler imaging.

Femoral Vein: No evidence of thrombus. Normal compressibility,
respiratory phasicity and response to augmentation.

Popliteal Vein: No evidence of thrombus. Normal compressibility,
respiratory phasicity and response to augmentation.

Calf Veins: No evidence of thrombus. Normal compressibility and flow
on color Doppler imaging.

Superficial Great Saphenous Vein: No evidence of thrombus. Normal
compressibility.

Venous Reflux:  None.

Other Findings:  None.
IMPRESSION: No evidence of DVT within the left lower extremity.

## 2021-07-21 ENCOUNTER — Other Ambulatory Visit: Payer: Self-pay | Admitting: Orthopedic Surgery

## 2021-07-21 ENCOUNTER — Encounter: Payer: Self-pay | Admitting: Orthopedic Surgery

## 2021-07-21 DIAGNOSIS — Z01818 Encounter for other preprocedural examination: Secondary | ICD-10-CM

## 2021-07-21 NOTE — H&P (Signed)
NAME: TERRELL OSTRAND MRN:   [id number] DOB:   01-21-1941     HISTORY AND PHYSICAL  CHIEF COMPLAINT:  right shoulder pain  HISTORY:   IRWIN TORAN a 80 y.o. male  with right  Shoulder Pain Patient complains of right shoulder pain. The symptoms began several years ago. Aggravating factors: repetitive activity. Pain is located between the neck and shoulder and around the acromioclavicular [facility]) joint. Discomfort is described as aching, burning, and sharp/stabbing. Symptoms are exacerbated by repetitive movements, overhead movements, and lying on the shoulder. Evaluation to date: plain films: abnormal osteoarhrtitis and MRI: abnormal rotator cuff tear . Therapy to date includes: rest, ice, avoidance of offending activity, OTC analgesics which are somewhat effective, and prescription NSAIDS which are somewhat effective.     Plan for right total shoulder arthroplasty reverse.  PAST MEDICAL HISTORY:   Past Medical History:  Diagnosis Date   Arthritis    Bradycardia    Chronic kidney disease    renal insufficiency   Hyperlipidemia    Hypertension    Sleep apnea    uses CPAP    PAST SURGICAL HISTORY:   Past Surgical History:  Procedure Laterality Date   COLONOSCOPY     JOINT REPLACEMENT Left 2019   shoulder   TOTAL SHOULDER ARTHROPLASTY Left 09/11/2017   Procedure: TOTAL SHOULDER ARTHROPLASTY;  Surgeon: Lyndle Herrlich, MD;  Location: [facility] ORS;  Service: Orthopedics;  Laterality: Left;   TOTAL SHOULDER ARTHROPLASTY Right 09/25/2019   Procedure: TOTAL SHOULDER ARTHROPLASTY;  Surgeon: Lyndle Herrlich, MD;  Location: [facility] ORS;  Service: Orthopedics;  Laterality: Right;    MEDICATIONS:  (Not in a hospital admission)   ALLERGIES:  No Known Allergies  REVIEW OF SYSTEMS:   Negative except HPI  FAMILY HISTORY:   Family History  Problem Relation Age of Onset   Colon cancer Mother    CAD Father    Breast cancer Sister    Diabetes Brother     SOCIAL HISTORY:   reports that he quit  smoking about 42 years ago. He has a 20.00 pack-year smoking history. He has quit using smokeless tobacco. He reports that he does not drink alcohol and does not use drugs.  PHYSICAL EXAM:  General appearance: alert, cooperative, and no distress Neck: no JVD and supple, symmetrical, trachea midline Resp: clear to auscultation bilaterally Cardio: regular rate and rhythm, S1, S2 normal, no murmur, click, rub or gallop GI: soft, non-tender; bowel sounds normal; no masses,  no organomegaly Extremities: extremities normal, atraumatic, no cyanosis or edema Pulses: 2+ and symmetric Skin: Skin color, texture, turgor normal. No rashes or lesions    LABORATORY STUDIES: No results for input(s): WBC, HGB, HCT, PLT in the last 72 hours.  No results for input(s): NA, K, CL, CO2, GLUCOSE, BUN, CREATININE, CALCIUM in the last 72 hours.  STUDIES/RESULTS:  No results found.  ASSESSMENT:  right shoulder oa and rotator cuff tear        Active Problems:   * No active hospital problems. *    PLAN:  Right total shoulder arthroplasty reverse  Altamese Cabal 07/21/2021. 3:20 PM

## 2021-07-21 NOTE — H&P (View-Only) (Signed)
NAME: Joel Mcintosh MRN:   [id number] DOB:   01-21-1941     HISTORY AND PHYSICAL  CHIEF COMPLAINT:  right shoulder pain  HISTORY:   Joel Mcintosh a 80 y.o. male  with right  Shoulder Pain Patient complains of right shoulder pain. The symptoms began several years ago. Aggravating factors: repetitive activity. Pain is located between the neck and shoulder and around the acromioclavicular [facility]) joint. Discomfort is described as aching, burning, and sharp/stabbing. Symptoms are exacerbated by repetitive movements, overhead movements, and lying on the shoulder. Evaluation to date: plain films: abnormal osteoarhrtitis and MRI: abnormal rotator cuff tear . Therapy to date includes: rest, ice, avoidance of offending activity, OTC analgesics which are somewhat effective, and prescription NSAIDS which are somewhat effective.     Plan for right total shoulder arthroplasty reverse.  PAST MEDICAL HISTORY:   Past Medical History:  Diagnosis Date   Arthritis    Bradycardia    Chronic kidney disease    renal insufficiency   Hyperlipidemia    Hypertension    Sleep apnea    uses CPAP    PAST SURGICAL HISTORY:   Past Surgical History:  Procedure Laterality Date   COLONOSCOPY     JOINT REPLACEMENT Left 2019   shoulder   TOTAL SHOULDER ARTHROPLASTY Left 09/11/2017   Procedure: TOTAL SHOULDER ARTHROPLASTY;  Surgeon: Lyndle Herrlich, MD;  Location: [facility] ORS;  Service: Orthopedics;  Laterality: Left;   TOTAL SHOULDER ARTHROPLASTY Right 09/25/2019   Procedure: TOTAL SHOULDER ARTHROPLASTY;  Surgeon: Lyndle Herrlich, MD;  Location: [facility] ORS;  Service: Orthopedics;  Laterality: Right;    MEDICATIONS:  (Not in a hospital admission)   ALLERGIES:  No Known Allergies  REVIEW OF SYSTEMS:   Negative except HPI  FAMILY HISTORY:   Family History  Problem Relation Age of Onset   Colon cancer Mother    CAD Father    Breast cancer Sister    Diabetes Brother     SOCIAL HISTORY:   reports that he quit  smoking about 42 years ago. He has a 20.00 pack-year smoking history. He has quit using smokeless tobacco. He reports that he does not drink alcohol and does not use drugs.  PHYSICAL EXAM:  General appearance: alert, cooperative, and no distress Neck: no JVD and supple, symmetrical, trachea midline Resp: clear to auscultation bilaterally Cardio: regular rate and rhythm, S1, S2 normal, no murmur, click, rub or gallop GI: soft, non-tender; bowel sounds normal; no masses,  no organomegaly Extremities: extremities normal, atraumatic, no cyanosis or edema Pulses: 2+ and symmetric Skin: Skin color, texture, turgor normal. No rashes or lesions    LABORATORY STUDIES: No results for input(s): WBC, HGB, HCT, PLT in the last 72 hours.  No results for input(s): NA, K, CL, CO2, GLUCOSE, BUN, CREATININE, CALCIUM in the last 72 hours.  STUDIES/RESULTS:  No results found.  ASSESSMENT:  right shoulder oa and rotator cuff tear        Active Problems:   * No active hospital problems. *    PLAN:  Right total shoulder arthroplasty reverse  Altamese Cabal 07/21/2021. 3:20 PM

## 2021-08-06 ENCOUNTER — Encounter
Admission: RE | Admit: 2021-08-06 | Discharge: 2021-08-06 | Disposition: A | Discharge status: Home / Self Care | Payer: Medicare Other | Source: Ambulatory Visit | Attending: Orthopedic Surgery | Admitting: Orthopedic Surgery

## 2021-08-06 ENCOUNTER — Other Ambulatory Visit: Payer: Self-pay

## 2021-08-06 VITALS — Ht 69.0 in | Wt 170.0 lb

## 2021-08-06 DIAGNOSIS — Z01818 Encounter for other preprocedural examination: Secondary | ICD-10-CM

## 2021-08-06 DIAGNOSIS — Z8614 Personal history of Methicillin resistant Staphylococcus aureus infection: Secondary | ICD-10-CM

## 2021-08-06 NOTE — Patient Instructions (Signed)
Your procedure is scheduled on: 08/16/20 Report to DAY SURGERY DEPARTMENT LOCATED ON 2ND FLOOR MEDICAL MALL ENTRANCE. To find out your arrival time please call [phone] between 1PM - 3PM on 08/13/20.  Remember: Instructions that are not followed completely may result in serious medical risk, up to and including death, or upon the discretion of your surgeon and anesthesiologist your surgery may need to be rescheduled.     _X__ 1. Do not eat food or drink any liquids after midnight the night before your procedure.                 No gum chewing or hard candies.   __X__2.  On the morning of surgery brush your teeth with toothpaste and water, you                 may rinse your mouth with mouthwash if you wish.  Do not swallow any              toothpaste of mouthwash.     _X__ 3.  No Alcohol for 24 hours before or after surgery.   _X__ 4.  Do Not Smoke or use e-cigarettes For 24 Hours Prior to Your Surgery.                 Do not use any chewable tobacco products for at least 6 hours prior to                 surgery.  ____  5.  Bring all medications with you on the day of surgery if instructed.   __X__  6.  Notify your doctor if there is any change in your medical condition      (cold, fever, infections).     Do not wear jewelry, make-up, hairpins, clips or nail polish. Do not wear lotions, powders, or perfumes.  Do not shave body hair 48 hours prior to surgery. Men may shave face and neck. Do not bring valuables to the hospital.    [facility] is not responsible for any belongings or valuables.  Contacts, dentures/partials or body piercings may not be worn into surgery. Bring a case for your contacts, glasses or hearing aids, a denture cup will be supplied. Leave your suitcase in the car. After surgery it may be brought to your room. For patients admitted to the hospital, discharge time is determined by your treatment team.   Patients discharged the day of surgery will not be allowed  to drive home.   Please read over the following fact sheets that you were given:   CHG soap, Benzoyl cream, Incentive spirometer  __X__ Take these medicines the morning of surgery with A SIP OF WATER:    1. none  2.   3.   4.  5.  6.  ____ Fleet Enema (as directed)   __X__ Use CHG Soap/SAGE wipes as directed  ____ Use inhalers on the day of surgery  ____ Stop metformin/Janumet/Farxiga 2 days prior to surgery    ____ Take 1/2 of usual insulin dose the night before surgery. No insulin the morning          of surgery.   ____ Stop Blood Thinners Coumadin/Plavix/Xarelto/Pleta/Pradaxa/Eliquis/Effient/Aspirin  on   Or contact your Surgeon, Cardiologist or Medical Doctor regarding  ability to stop your blood thinners  __X__ Stop Anti-inflammatories 7 days before surgery such as Advil, Ibuprofen, Motrin,  BC or Goodies Powder, Naprosyn, Naproxen, Aleve, Aspirin and Meloxicam   __X__ Stop all  herbal supplements, fish oil or vitamin E for 7 days until after surgery. Garlic, Melatonin, Fish oil   ____ Bring C-Pap to the hospital.

## 2021-08-10 ENCOUNTER — Other Ambulatory Visit: Payer: Self-pay

## 2021-08-10 ENCOUNTER — Other Ambulatory Visit
Admission: RE | Admit: 2021-08-10 | Discharge: 2021-08-10 | Disposition: A | Discharge status: Home / Self Care | Payer: Medicare Other | Source: Ambulatory Visit | Attending: Orthopedic Surgery | Admitting: Orthopedic Surgery

## 2021-08-10 DIAGNOSIS — Z8614 Personal history of Methicillin resistant Staphylococcus aureus infection: Secondary | ICD-10-CM | POA: Diagnosis not present

## 2021-08-10 DIAGNOSIS — Z20822 Contact with and (suspected) exposure to covid-19: Secondary | ICD-10-CM | POA: Insufficient documentation

## 2021-08-10 DIAGNOSIS — Z01818 Encounter for other preprocedural examination: Secondary | ICD-10-CM | POA: Diagnosis present

## 2021-08-10 DIAGNOSIS — I10 Essential (primary) hypertension: Secondary | ICD-10-CM | POA: Insufficient documentation

## 2021-08-10 LAB — BASIC METABOLIC PANEL
Anion gap: 5 (ref 5–15)
BUN: 11 mg/dL (ref 8–23)
CO2: 30 mmol/L (ref 22–32)
Calcium: 9 mg/dL (ref 8.9–10.3)
Chloride: 106 mmol/L (ref 98–111)
Creatinine, Ser: 0.61 mg/dL (ref 0.61–1.24)
GFR, Estimated: >60 mL/min (ref >60)
Glucose, Bld: 146 mg/dL — ABNORMAL HIGH (ref 70–99)
Potassium: 3.7 mmol/L (ref 3.5–5.1)
Sodium: 141 mmol/L (ref 135–145)

## 2021-08-10 LAB — URINALYSIS, ROUTINE W REFLEX MICROSCOPIC
Bilirubin Urine: NEGATIVE
Glucose, UA: NEGATIVE mg/dL
Hgb urine dipstick: NEGATIVE
Ketones, ur: NEGATIVE mg/dL
Leukocytes,Ua: NEGATIVE
Nitrite: NEGATIVE
Protein, ur: NEGATIVE mg/dL
Specific Gravity, Urine: 1.008 (ref 1.005–1.030)
pH: 7 (ref 5.0–8.0)

## 2021-08-10 LAB — SURGICAL PCR SCREEN
MRSA, PCR: NEGATIVE
Staphylococcus aureus: NEGATIVE

## 2021-08-10 LAB — CBC
HCT: 37.7 % — ABNORMAL LOW (ref 39.0–52.0)
Hemoglobin: 13.4 g/dL (ref 13.0–17.0)
MCH: 32.1 pg (ref 26.0–34.0)
MCHC: 35.5 g/dL (ref 30.0–36.0)
MCV: 90.2 fL (ref 80.0–100.0)
Platelets: 179 10*3/uL (ref 150–400)
RBC: 4.18 MIL/uL — ABNORMAL LOW (ref 4.22–5.81)
RDW: 12.9 % (ref 11.5–15.5)
WBC: 6 10*3/uL (ref 4.0–10.5)
nRBC: 0 % (ref 0.0–0.2)

## 2021-08-11 LAB — SARS CORONAVIRUS 2 (TAT 6-24 HRS): SARS Coronavirus 2: NEGATIVE

## 2021-08-12 ENCOUNTER — Other Ambulatory Visit: Payer: Medicare Other

## 2021-08-16 ENCOUNTER — Inpatient Hospital Stay: Payer: Medicare Other | Admitting: Urgent Care

## 2021-08-16 ENCOUNTER — Inpatient Hospital Stay: Payer: Medicare Other

## 2021-08-16 ENCOUNTER — Other Ambulatory Visit: Payer: Self-pay

## 2021-08-16 ENCOUNTER — Inpatient Hospital Stay: Payer: Medicare Other | Admitting: Certified Registered Nurse Anesthetist

## 2021-08-16 ENCOUNTER — Inpatient Hospital Stay
Admission: RE | Admit: 2021-08-16 | Discharge: 2021-08-17 | DRG: 483 | Disposition: A | Discharge status: Home / Self Care | Payer: Medicare Other | Attending: Orthopedic Surgery | Admitting: Orthopedic Surgery

## 2021-08-16 ENCOUNTER — Encounter
Admission: RE | Disposition: A | Discharge status: Home / Self Care | Payer: Self-pay | Source: Home / Self Care | Attending: Orthopedic Surgery

## 2021-08-16 ENCOUNTER — Encounter: Payer: Self-pay | Admitting: Orthopedic Surgery

## 2021-08-16 DIAGNOSIS — M19011 Primary osteoarthritis, right shoulder: Secondary | ICD-10-CM | POA: Diagnosis present

## 2021-08-16 DIAGNOSIS — Z87891 Personal history of nicotine dependence: Secondary | ICD-10-CM | POA: Diagnosis not present

## 2021-08-16 DIAGNOSIS — I10 Essential (primary) hypertension: Secondary | ICD-10-CM | POA: Diagnosis present

## 2021-08-16 DIAGNOSIS — Z8249 Family history of ischemic heart disease and other diseases of the circulatory system: Secondary | ICD-10-CM | POA: Diagnosis not present

## 2021-08-16 DIAGNOSIS — M75101 Unspecified rotator cuff tear or rupture of right shoulder, not specified as traumatic: Secondary | ICD-10-CM | POA: Diagnosis present

## 2021-08-16 DIAGNOSIS — E785 Hyperlipidemia, unspecified: Secondary | ICD-10-CM | POA: Diagnosis present

## 2021-08-16 DIAGNOSIS — Z8616 Personal history of COVID-19: Secondary | ICD-10-CM

## 2021-08-16 DIAGNOSIS — Z800 Family history of malignant neoplasm of digestive organs: Secondary | ICD-10-CM | POA: Diagnosis not present

## 2021-08-16 DIAGNOSIS — Z419 Encounter for procedure for purposes other than remedying health state, unspecified: Secondary | ICD-10-CM

## 2021-08-16 DIAGNOSIS — Z833 Family history of diabetes mellitus: Secondary | ICD-10-CM

## 2021-08-16 DIAGNOSIS — Z96611 Presence of right artificial shoulder joint: Secondary | ICD-10-CM | POA: Diagnosis present

## 2021-08-16 HISTORY — PX: TOTAL SHOULDER REVISION: SHX6130

## 2021-08-16 SURGERY — REVISION, TOTAL ARTHROPLASTY, SHOULDER
Anesthesia: General | Site: Shoulder | Laterality: Right

## 2021-08-16 MED ORDER — FENTANYL CITRATE (PF) 100 MCG/2ML IJ SOLN
INTRAMUSCULAR | Status: DC | PRN
  Administered 2021-08-16: 50 ug via INTRAVENOUS

## 2021-08-16 MED ORDER — ONDANSETRON HCL 4 MG PO TABS: 4.0000 mg | ORAL_TABLET | Freq: Four times a day (QID) | ORAL | Status: DC | PRN

## 2021-08-16 MED ORDER — PHENYLEPHRINE HCL-NACL 20-0.9 MG/250ML-% IV SOLN
INTRAVENOUS | Status: DC | PRN
  Administered 2021-08-16: 40 ug/min via INTRAVENOUS

## 2021-08-16 MED ORDER — GLYCOPYRROLATE 0.2 MG/ML IJ SOLN
INTRAMUSCULAR | Status: DC | PRN
  Administered 2021-08-16: .2 mg via INTRAVENOUS

## 2021-08-16 MED ORDER — ONDANSETRON HCL 4 MG/2ML IJ SOLN: 4.0000 mg | Freq: Four times a day (QID) | INTRAMUSCULAR | Status: DC | PRN

## 2021-08-16 MED ORDER — EPHEDRINE SULFATE 50 MG/ML IJ SOLN
INTRAMUSCULAR | Status: DC | PRN
  Administered 2021-08-16 (×2): 10 mg via INTRAVENOUS

## 2021-08-16 MED ORDER — HYDROCHLOROTHIAZIDE 25 MG PO TABS
25.0000 mg | ORAL_TABLET | Freq: Every day | ORAL | Status: DC
  Administered 2021-08-17: 25 mg via ORAL
  Filled 2021-08-16: qty 1

## 2021-08-16 MED ORDER — FENTANYL CITRATE PF 50 MCG/ML IJ SOSY
PREFILLED_SYRINGE | INTRAMUSCULAR | Status: AC
  Administered 2021-08-16: 50 ug via INTRAVENOUS
  Filled 2021-08-16: qty 1

## 2021-08-16 MED ORDER — MENTHOL 3 MG MT LOZG
1.0000 | LOZENGE | OROMUCOSAL | Status: DC | PRN
  Filled 2021-08-16: qty 9

## 2021-08-16 MED ORDER — LACTATED RINGERS IV SOLN: INTRAVENOUS | Status: DC

## 2021-08-16 MED ORDER — LOSARTAN POTASSIUM-HCTZ 100-25 MG PO TABS: 1.0000 | ORAL_TABLET | Freq: Every day | ORAL | Status: DC

## 2021-08-16 MED ORDER — MAGNESIUM OXIDE 400 MG PO TABS
400.0000 mg | ORAL_TABLET | Freq: Every day | ORAL | Status: DC
  Administered 2021-08-16 – 2021-08-17 (×2): 400 mg via ORAL
  Filled 2021-08-16 (×2): qty 1

## 2021-08-16 MED ORDER — TRANEXAMIC ACID-NACL 1000-0.7 MG/100ML-% IV SOLN
1000.0000 mg | INTRAVENOUS | Status: AC
  Administered 2021-08-16: 1000 mg via INTRAVENOUS

## 2021-08-16 MED ORDER — ROCURONIUM BROMIDE 100 MG/10ML IV SOLN
INTRAVENOUS | Status: DC | PRN
  Administered 2021-08-16: 20 mg via INTRAVENOUS
  Administered 2021-08-16: 50 mg via INTRAVENOUS

## 2021-08-16 MED ORDER — TAMSULOSIN HCL 0.4 MG PO CAPS
0.4000 mg | ORAL_CAPSULE | Freq: Every day | ORAL | Status: DC
  Administered 2021-08-16 – 2021-08-17 (×2): 0.4 mg via ORAL
  Filled 2021-08-16 (×2): qty 1

## 2021-08-16 MED ORDER — DOCUSATE SODIUM 100 MG PO CAPS
ORAL_CAPSULE | ORAL | Status: AC
  Administered 2021-08-16: 100 mg via ORAL
  Filled 2021-08-16: qty 1

## 2021-08-16 MED ORDER — PRONTOSAN WOUND IRRIGATION OPTIME
TOPICAL | Status: DC | PRN
  Administered 2021-08-16: 1 via TOPICAL

## 2021-08-16 MED ORDER — CEFAZOLIN SODIUM-DEXTROSE 2-4 GM/100ML-% IV SOLN
2.0000 g | INTRAVENOUS | Status: AC
  Administered 2021-08-16: 2 g via INTRAVENOUS

## 2021-08-16 MED ORDER — DEXAMETHASONE SODIUM PHOSPHATE 10 MG/ML IJ SOLN
INTRAMUSCULAR | Status: DC | PRN
  Administered 2021-08-16: 5 mg via INTRAVENOUS

## 2021-08-16 MED ORDER — BUPIVACAINE LIPOSOME 1.3 % IJ SUSP
INTRAMUSCULAR | Status: DC | PRN
  Administered 2021-08-16: 10 mL via PERINEURAL

## 2021-08-16 MED ORDER — MIDAZOLAM HCL 2 MG/2ML IJ SOLN: 1.0000 mg | INTRAMUSCULAR | Status: DC | PRN

## 2021-08-16 MED ORDER — FAMOTIDINE 20 MG PO TABS: 20.0000 mg | ORAL_TABLET | Freq: Once | ORAL | Status: AC

## 2021-08-16 MED ORDER — FAMOTIDINE 20 MG PO TABS
ORAL_TABLET | ORAL | Status: AC
  Administered 2021-08-16: 20 mg via ORAL
  Filled 2021-08-16: qty 1

## 2021-08-16 MED ORDER — KETOROLAC TROMETHAMINE 15 MG/ML IJ SOLN
INTRAMUSCULAR | Status: AC
  Administered 2021-08-16: 7.5 mg via INTRAVENOUS
  Filled 2021-08-16: qty 1

## 2021-08-16 MED ORDER — LIDOCAINE HCL (CARDIAC) PF 100 MG/5ML IV SOSY
PREFILLED_SYRINGE | INTRAVENOUS | Status: DC | PRN
  Administered 2021-08-16: 70 mg via INTRAVENOUS

## 2021-08-16 MED ORDER — PHENOL 1.4 % MT LIQD
1.0000 | OROMUCOSAL | Status: DC | PRN
  Filled 2021-08-16: qty 177

## 2021-08-16 MED ORDER — FERROUS SULFATE 325 (65 FE) MG PO TABS
325.0000 mg | ORAL_TABLET | Freq: Three times a day (TID) | ORAL | Status: DC
  Administered 2021-08-16 – 2021-08-17 (×2): 325 mg via ORAL
  Filled 2021-08-16 (×4): qty 1

## 2021-08-16 MED ORDER — SUGAMMADEX SODIUM 200 MG/2ML IV SOLN
INTRAVENOUS | Status: DC | PRN
  Administered 2021-08-16: 200 mg via INTRAVENOUS

## 2021-08-16 MED ORDER — METOCLOPRAMIDE HCL 10 MG PO TABS: 5.0000 mg | ORAL_TABLET | Freq: Three times a day (TID) | ORAL | Status: DC | PRN

## 2021-08-16 MED ORDER — CHLORHEXIDINE GLUCONATE 0.12 % MT SOLN: 15.0000 mL | Freq: Once | OROMUCOSAL | Status: AC

## 2021-08-16 MED ORDER — ONDANSETRON HCL 4 MG/2ML IJ SOLN
INTRAMUSCULAR | Status: DC | PRN
  Administered 2021-08-16: 4 mg via INTRAVENOUS

## 2021-08-16 MED ORDER — CHLORHEXIDINE GLUCONATE 0.12 % MT SOLN
OROMUCOSAL | Status: AC
  Administered 2021-08-16: 15 mL via OROMUCOSAL
  Filled 2021-08-16: qty 15

## 2021-08-16 MED ORDER — 0.9 % SODIUM CHLORIDE (POUR BTL) OPTIME
TOPICAL | Status: DC | PRN
  Administered 2021-08-16: 400 mL

## 2021-08-16 MED ORDER — BUPIVACAINE HCL (PF) 0.5 % IJ SOLN
INTRAMUSCULAR | Status: AC
  Filled 2021-08-16: qty 10

## 2021-08-16 MED ORDER — CEFAZOLIN SODIUM-DEXTROSE 2-4 GM/100ML-% IV SOLN
INTRAVENOUS | Status: AC
  Filled 2021-08-16: qty 100

## 2021-08-16 MED ORDER — MIDAZOLAM HCL 2 MG/2ML IJ SOLN
INTRAMUSCULAR | Status: AC
  Administered 2021-08-16: 1 mg via INTRAVENOUS
  Filled 2021-08-16: qty 2

## 2021-08-16 MED ORDER — METOCLOPRAMIDE HCL 5 MG/ML IJ SOLN: 5.0000 mg | Freq: Three times a day (TID) | INTRAMUSCULAR | Status: DC | PRN

## 2021-08-16 MED ORDER — FENTANYL CITRATE (PF) 100 MCG/2ML IJ SOLN
INTRAMUSCULAR | Status: AC
  Filled 2021-08-16: qty 2

## 2021-08-16 MED ORDER — MORPHINE SULFATE (PF) 4 MG/ML IV SOLN: 0.5000 mg | INTRAVENOUS | Status: DC | PRN

## 2021-08-16 MED ORDER — ROSUVASTATIN CALCIUM 5 MG PO TABS
5.0000 mg | ORAL_TABLET | Freq: Every day | ORAL | Status: DC
  Administered 2021-08-16 – 2021-08-17 (×2): 5 mg via ORAL
  Filled 2021-08-16 (×2): qty 1

## 2021-08-16 MED ORDER — ACETAMINOPHEN 325 MG PO TABS: 325.0000 mg | ORAL_TABLET | Freq: Four times a day (QID) | ORAL | Status: DC | PRN

## 2021-08-16 MED ORDER — AMLODIPINE BESYLATE 10 MG PO TABS
10.0000 mg | ORAL_TABLET | Freq: Every day | ORAL | Status: DC
  Administered 2021-08-16: 10 mg via ORAL
  Filled 2021-08-16 (×2): qty 1

## 2021-08-16 MED ORDER — ONDANSETRON HCL 4 MG/2ML IJ SOLN: 4.0000 mg | Freq: Once | INTRAMUSCULAR | Status: DC | PRN

## 2021-08-16 MED ORDER — FENTANYL CITRATE PF 50 MCG/ML IJ SOSY: 50.0000 ug | PREFILLED_SYRINGE | INTRAMUSCULAR | Status: DC | PRN

## 2021-08-16 MED ORDER — ORAL CARE MOUTH RINSE: 15.0000 mL | Freq: Once | OROMUCOSAL | Status: AC

## 2021-08-16 MED ORDER — TRANEXAMIC ACID-NACL 1000-0.7 MG/100ML-% IV SOLN
INTRAVENOUS | Status: AC
  Filled 2021-08-16: qty 100

## 2021-08-16 MED ORDER — METOPROLOL TARTRATE 25 MG PO TABS
ORAL_TABLET | ORAL | Status: AC
  Filled 2021-08-16: qty 1

## 2021-08-16 MED ORDER — KETOROLAC TROMETHAMINE 15 MG/ML IJ SOLN
7.5000 mg | Freq: Four times a day (QID) | INTRAMUSCULAR | Status: DC
  Administered 2021-08-16: 7.5 mg via INTRAVENOUS

## 2021-08-16 MED ORDER — ACETAMINOPHEN 10 MG/ML IV SOLN
INTRAVENOUS | Status: AC
  Filled 2021-08-16: qty 100

## 2021-08-16 MED ORDER — BUPIVACAINE HCL (PF) 0.5 % IJ SOLN
INTRAMUSCULAR | Status: DC | PRN
  Administered 2021-08-16: 10 mL via PERINEURAL

## 2021-08-16 MED ORDER — DOCUSATE SODIUM 100 MG PO CAPS: 100.0000 mg | ORAL_CAPSULE | Freq: Two times a day (BID) | ORAL | Status: DC

## 2021-08-16 MED ORDER — KETOROLAC TROMETHAMINE 15 MG/ML IJ SOLN
INTRAMUSCULAR | Status: AC
  Filled 2021-08-16: qty 1

## 2021-08-16 MED ORDER — HYDROCODONE-ACETAMINOPHEN 7.5-325 MG PO TABS: 1.0000 | ORAL_TABLET | ORAL | Status: DC | PRN

## 2021-08-16 MED ORDER — PROPOFOL 10 MG/ML IV BOLUS
INTRAVENOUS | Status: DC | PRN
Start: 2021-08-16 — End: 2021-08-16
  Administered 2021-08-16: 100 mg via INTRAVENOUS

## 2021-08-16 MED ORDER — BUPIVACAINE-EPINEPHRINE (PF) 0.25% -1:200000 IJ SOLN
INTRAMUSCULAR | Status: DC | PRN
  Administered 2021-08-16: 15 mL

## 2021-08-16 MED ORDER — HYDROCODONE-ACETAMINOPHEN 5-325 MG PO TABS
1.0000 | ORAL_TABLET | ORAL | Status: DC | PRN
  Administered 2021-08-17: 1 via ORAL

## 2021-08-16 MED ORDER — LIDOCAINE HCL (PF) 1 % IJ SOLN
INTRAMUSCULAR | Status: AC
  Filled 2021-08-16: qty 5

## 2021-08-16 MED ORDER — BUPIVACAINE LIPOSOME 1.3 % IJ SUSP
INTRAMUSCULAR | Status: AC
  Filled 2021-08-16: qty 10

## 2021-08-16 MED ORDER — LOSARTAN POTASSIUM 50 MG PO TABS
100.0000 mg | ORAL_TABLET | Freq: Every day | ORAL | Status: DC
  Administered 2021-08-17: 100 mg via ORAL
  Filled 2021-08-16: qty 2

## 2021-08-16 MED ORDER — ACETAMINOPHEN 10 MG/ML IV SOLN
INTRAVENOUS | Status: DC | PRN
  Administered 2021-08-16: 1000 mg via INTRAVENOUS

## 2021-08-16 MED ORDER — ASPIRIN EC 81 MG PO TBEC
81.0000 mg | DELAYED_RELEASE_TABLET | Freq: Every day | ORAL | Status: DC
  Administered 2021-08-17: 81 mg via ORAL
  Filled 2021-08-16: qty 1

## 2021-08-16 MED ORDER — LOSARTAN POTASSIUM 50 MG PO TABS: 100.0000 mg | ORAL_TABLET | Freq: Every day | ORAL | Status: DC

## 2021-08-16 MED ORDER — BUPIVACAINE-EPINEPHRINE (PF) 0.25% -1:200000 IJ SOLN
INTRAMUSCULAR | Status: AC
  Filled 2021-08-16: qty 30

## 2021-08-16 MED ORDER — FENTANYL CITRATE (PF) 100 MCG/2ML IJ SOLN: 25.0000 ug | INTRAMUSCULAR | Status: DC | PRN

## 2021-08-16 MED ORDER — SODIUM CHLORIDE 0.9 % IR SOLN
Status: DC | PRN
  Administered 2021-08-16: 500 mL

## 2021-08-16 MED ORDER — CEFAZOLIN SODIUM-DEXTROSE 2-4 GM/100ML-% IV SOLN
2.0000 g | Freq: Four times a day (QID) | INTRAVENOUS | Status: AC
  Administered 2021-08-16 (×2): 2 g via INTRAVENOUS

## 2021-08-16 MED ORDER — POTASSIUM CHLORIDE CRYS ER 20 MEQ PO TBCR: 20.0000 meq | EXTENDED_RELEASE_TABLET | Freq: Every day | ORAL | Status: DC

## 2021-08-16 MED ORDER — METOPROLOL SUCCINATE ER 25 MG PO TB24
25.0000 mg | ORAL_TABLET | Freq: Every day | ORAL | Status: DC
  Administered 2021-08-16 – 2021-08-17 (×2): 25 mg via ORAL
  Filled 2021-08-16 (×2): qty 1

## 2021-08-16 MED ORDER — LIDOCAINE HCL (PF) 1 % IJ SOLN
INTRAMUSCULAR | Status: DC | PRN
  Administered 2021-08-16: 4 mL via SUBCUTANEOUS

## 2021-08-16 MED ORDER — DOXAZOSIN MESYLATE 4 MG PO TABS
8.0000 mg | ORAL_TABLET | Freq: Every day | ORAL | Status: DC
  Administered 2021-08-16: 8 mg via ORAL
  Filled 2021-08-16 (×2): qty 2
  Filled 2021-08-16 (×2): qty 1

## 2021-08-16 MED ORDER — KETOROLAC TROMETHAMINE 30 MG/ML IJ SOLN
INTRAMUSCULAR | Status: DC | PRN
  Administered 2021-08-16: 15 mg via INTRAVENOUS

## 2021-08-16 SURGICAL SUPPLY — 69 items
BLADE BOVIE TIP EXT 4 (BLADE) ×2 IMPLANT
BLADE SAW 90X25X1.19 OSCILLAT (BLADE) ×2 IMPLANT
BOWL CEMENT MIX W/ADAPTER (MISCELLANEOUS) ×1 IMPLANT
BRUSH SCRUB EZ  4% CHG (MISCELLANEOUS) ×1
BRUSH SCRUB EZ 4% CHG (MISCELLANEOUS) ×2 IMPLANT
CHLORAPREP W/TINT 26 (MISCELLANEOUS) ×4 IMPLANT
COVER BACK TABLE REUSABLE LG (DRAPES) ×2 IMPLANT
CUP D38 DXTEND STAND PLUS 6 HU (Orthopedic Implant) ×2 IMPLANT
CUP STD D38 DXTEND PLUS 6 HU (Orthopedic Implant) IMPLANT
DRAPE 3/4 80X56 (DRAPES) ×4 IMPLANT
DRAPE INCISE IOBAN 66X60 STRL (DRAPES) ×4 IMPLANT
DRAPE U-SHAPE 47X51 STRL (DRAPES) ×3 IMPLANT
DRSG AQUACEL AG ADV 3.5X10 (GAUZE/BANDAGES/DRESSINGS) ×2 IMPLANT
ELECT BLADE 6.5 EXT (BLADE) IMPLANT
ELECT REM PT RETURN 9FT ADLT (ELECTROSURGICAL) ×2
ELECTRODE REM PT RTRN 9FT ADLT (ELECTROSURGICAL) ×1 IMPLANT
GAUZE SPONGE 4X4 12PLY STRL (GAUZE/BANDAGES/DRESSINGS) IMPLANT
GAUZE XEROFORM 1X8 LF (GAUZE/BANDAGES/DRESSINGS) ×2 IMPLANT
GLENOSPHERE DELTA XTEND LAT 38 (Miscellaneous) ×1 IMPLANT
GLOVE SURG ORTHO LTX SZ8 (GLOVE) ×2 IMPLANT
GLOVE SURG UNDER LTX SZ8 (GLOVE) ×2 IMPLANT
GOWN STRL REUS W/ TWL LRG LVL3 (GOWN DISPOSABLE) ×1 IMPLANT
GOWN STRL REUS W/ TWL XL LVL3 (GOWN DISPOSABLE) ×1 IMPLANT
GOWN STRL REUS W/TWL LRG LVL3 (GOWN DISPOSABLE) ×1
GOWN STRL REUS W/TWL XL LVL3 (GOWN DISPOSABLE) ×1
HOOD PEEL AWAY FLYTE STAYCOOL (MISCELLANEOUS) ×3 IMPLANT
IMPL DELTA XTND MOD CEN 155 S1 (Shoulder) ×1 IMPLANT
IV NS 1000ML (IV SOLUTION) ×1
IV NS 1000ML BAXH (IV SOLUTION) ×1 IMPLANT
IV NS IRRIG 3000ML ARTHROMATIC (IV SOLUTION) ×1 IMPLANT
KIT STABILIZATION SHOULDER (MISCELLANEOUS) ×2 IMPLANT
KIT TURNOVER KIT A (KITS) ×2 IMPLANT
MANIFOLD NEPTUNE II (INSTRUMENTS) ×2 IMPLANT
MASK FACE SPIDER DISP (MASK) ×2 IMPLANT
MAT ABSORB  FLUID 56X50 GRAY (MISCELLANEOUS) ×1
MAT ABSORB FLUID 56X50 GRAY (MISCELLANEOUS) ×1 IMPLANT
METAGLENE DELTA EXTEND (Trauma) IMPLANT
METAGLENE DXTEND (Trauma) ×2 IMPLANT
NDL REVERSE CUT 1/2 CRC (NEEDLE) ×1 IMPLANT
NDL SAFETY ECLIPSE 18X1.5 (NEEDLE) IMPLANT
NEEDLE HYPO 18GX1.5 SHARP (NEEDLE)
NEEDLE REVERSE CUT 1/2 CRC (NEEDLE) ×2 IMPLANT
NS IRRIG 1000ML POUR BTL (IV SOLUTION) ×1 IMPLANT
PACK ARTHROSCOPY SHOULDER (MISCELLANEOUS) ×2 IMPLANT
PAD ARMBOARD 7.5X6 YLW CONV (MISCELLANEOUS) ×4 IMPLANT
PIN METAGLENE 2.5 (PIN) ×1 IMPLANT
PULSAVAC PLUS IRRIG FAN TIP (DISPOSABLE) ×2
SCREW 4.5X18MM (Screw) ×1 IMPLANT
SCREW 4.5X24MM (Screw) ×1 IMPLANT
SCREW 4.5X36MM (Screw) ×1 IMPLANT
SCREW BN 18X4.5XSTRL SHLDR (Screw) IMPLANT
SCREW BN 24X4.5XLCK STRL (Screw) IMPLANT
SLING ARM LRG DEEP (SOFTGOODS) ×2 IMPLANT
SLING ULTRA II LG (MISCELLANEOUS) ×1 IMPLANT
SOLUTION PRONTOSAN WOUND 350ML (IRRIGATION / IRRIGATOR) ×1 IMPLANT
SPONGE T-LAP 18X18 ~~LOC~~+RFID (SPONGE) ×4 IMPLANT
STAPLER SKIN PROX 35W (STAPLE) ×2 IMPLANT
STRAP SAFETY 5IN WIDE (MISCELLANEOUS) ×2 IMPLANT
SUT TICRON 2-0 30IN 311381 (SUTURE) ×1 IMPLANT
SUT VIC AB 0 CT2 27 (SUTURE) ×4 IMPLANT
SUT VIC AB 2-0 CT1 18 (SUTURE) ×3 IMPLANT
SUT VIC AB PLUS 45CM 1-MO-4 (SUTURE) ×1 IMPLANT
SYR 10ML LL (SYRINGE) ×2 IMPLANT
SYR TOOMEY IRRIG 70ML (MISCELLANEOUS) ×2
SYRINGE TOOMEY IRRIG 70ML (MISCELLANEOUS) ×1 IMPLANT
TAPE SUT 30 1/2 CRC GREEN (SUTURE) ×6 IMPLANT
TIP FAN IRRIG PULSAVAC PLUS (DISPOSABLE) ×1 IMPLANT
WATER STERILE IRR 1000ML POUR (IV SOLUTION) ×4 IMPLANT
WATER STERILE IRR 500ML POUR (IV SOLUTION) ×2 IMPLANT

## 2021-08-16 NOTE — Anesthesia Procedure Notes (Signed)
Anesthesia Regional Block: Interscalene brachial plexus block   Pre-Anesthetic Checklist: , timeout performed,  Correct Patient, Correct Site, Correct Laterality,  Correct Procedure, Correct Position, site marked,  Risks and benefits discussed,  Surgical consent,  Pre-op evaluation,  At surgeon's request and post-op pain management  Laterality: Right and Upper  Prep: chloraprep       Needles:  Injection technique: Single-shot  Needle Type: Stimiplex     Needle Length: 5cm  Needle Gauge: 22     Additional Needles:   Procedures:,,,, ultrasound used (permanent image in chart),,    Narrative:  Start time: 08/16/2021 12:28 PM End time: 08/16/2021 12:30 PM Injection made incrementally with aspirations every 5 mL.  Performed by: Personally  Anesthesiologist: Lenard Simmer, MD  Additional Notes: Functioning IV was confirmed and monitors were applied.  A 40mm 22ga Stimuplex needle was used. Sterile prep and drape,hand hygiene and sterile gloves were used.  Negative aspiration and negative test dose prior to incremental administration of local anesthetic. The patient tolerated the procedure well.

## 2021-08-16 NOTE — Transfer of Care (Addendum)
Immediate Anesthesia Transfer of Care Note  Patient: Joel Mcintosh  Procedure(s) Performed: TOTAL SHOULDER REVISION (Right: Shoulder)  Patient Location: PACU  Anesthesia Type:General  Level of Consciousness: awake, drowsy and patient cooperative  Airway & Oxygen Therapy: Patient Spontanous Breathing and Patient connected to face mask oxygen  Post-op Assessment: Report given to RN and Post -op Vital signs reviewed and stable  Post vital signs: Reviewed and stable  Last Vitals:  Vitals Value Taken Time  BP 126/62   Temp    Pulse 66 08/16/21 1507  Resp 15 08/16/21 1507  SpO2 100 % 08/16/21 1507  Vitals shown include unvalidated device data.  Last Pain:  Vitals:   08/16/21 1119  TempSrc: Tympanic  PainSc: 2          Complications: No notable events documented.

## 2021-08-16 NOTE — Anesthesia Preprocedure Evaluation (Addendum)
Anesthesia Evaluation  Patient identified by MRN, date of birth, ID band Patient awake    Reviewed: Allergy & Precautions, NPO status , Patient's Chart, lab work & pertinent test results  History of Anesthesia Complications Negative for: history of anesthetic complications  Airway Mallampati: III  TM Distance: >3 FB Neck ROM: Full    Dental  (+) Teeth Intact, Dental Advisory Given   Pulmonary neg shortness of breath, sleep apnea and Continuous Positive Airway Pressure Ventilation , neg COPD, neg recent URI, Patient abstained from smoking.Not current smoker, former smoker,  In 2019 after total shoulder surgery with interscalene block, patient developed pneumothorax in PACU requiring chest tube. Has since resolved.   Pulmonary exam normal breath sounds clear to auscultation       Cardiovascular Exercise Tolerance: Good METShypertension, (-) angina(-) CAD and (-) Past MI negative cardio ROS  (-) dysrhythmias  Rhythm:Regular Rate:Normal - Systolic murmurs    Neuro/Psych negative neurological ROS  negative psych ROS   GI/Hepatic neg GERD  ,(+)     (-) substance abuse  ,   Endo/Other  neg diabetes  Renal/GU CRFRenal disease     Musculoskeletal   Abdominal   Peds  Hematology   Anesthesia Other Findings Past Medical History: No date: Arthritis No date: Bradycardia No date: Chronic kidney disease     Comment:  renal insufficiency No date: Hyperlipidemia No date: Hypertension No date: Sleep apnea     Comment:  uses CPAP  Patient does note finger numbness with raising of his arms/bending of his arms at the elbow.  It is not continuous numbness/tingling, but rather seems positional in nature.  Reproductive/Obstetrics                            Anesthesia Physical  Anesthesia Plan  ASA: 2  Anesthesia Plan: General and Regional   Post-op Pain Management: Regional block   Induction:  Intravenous  PONV Risk Score and Plan: 2 and Ondansetron, Dexamethasone and Treatment may vary due to age or medical condition  Airway Management Planned: Oral ETT  Additional Equipment: None  Intra-op Plan:   Post-operative Plan: Extubation in OR  Informed Consent: I have reviewed the patients History and Physical, chart, labs and discussed the procedure including the risks, benefits and alternatives for the proposed anesthesia with the patient or authorized representative who has indicated his/her understanding and acceptance.     Dental advisory given  Plan Discussed with: CRNA and Surgeon  Anesthesia Plan Comments: (Discussed risks of anesthesia with patient, including PONV, sore throat, lip/dental damage. Rare risks discussed as well, such as cardiorespiratory sequelae. Patient understands. Discussed r/b/a of interscalene block, including elective nature. Risks discussed: - Rare: bleeding, infection, nerve damage - shortness of breath from hemidiaphragmatic paralysis - discussed very rare risk of pneumothorax with this block. - unilateral horner's syndrome - poor/non-working blocks Patient understands to all of the above including the pulmonary risks and agrees. Patient encouraged to wear CPAP nightly (patient says he wears it every night ) after block. )       Anesthesia Quick Evaluation

## 2021-08-16 NOTE — Op Note (Signed)
08/16/2021  3:18 PM  Patient:   Joel Mcintosh  Pre-Op Diagnosis:   R44.315 Presence of right artificial shoulder joint  Post-Op Diagnosis:   Same  Procedure:   Reverse right total shoulder arthroplasty, revision from prior total shoulder replacement  Surgeon:   Cassell Smiles, MD  Assistant:   Altamese Cabal, PA-C  Anesthesia:   General endotracheal with an interscalene block placed preoperatively by the anesthesiologist.  Findings:   As above.  Complications:   None  EBL:  25 cc  Drains:   None  Closure:   Staples  Implants:   cementless metaglene with 2 locking and one non-locking screw, 38 mm standard glenosphere, size 1 centered 155 degree epiphysis, humeral PE cup 38/+6 mm   Brief Clinical Note:   The patient has failed multiple conservative treatments for painful total shoulder replacement including medications and therapy. The patient presents at this time for a conversion to reverse right total shoulder arthroplasty.  Procedure:   The patient underwent placement of an interscalene block by the anesthesiologist in the preoperative holding area before being brought into the operating room and lain in the supine position. The patient then underwent general endotracheal intubation and anesthesia before a Foley catheter was inserted and the patient repositioned in the beach chair position using the beach chair positioner. The right shoulder and upper extremity were prepped with ChloraPrep solution before being draped sterilely. Preoperative antibiotics were administered. A standard anterior approach to the shoulder was made through the prior incision. The incision was carried down through the subcutaneous tissues to expose the deltopectoral fascia. The interval between the deltoid and pectoralis muscles was identified and this plane developed, retracting the cephalic vein laterally with the deltoid muscle. The conjoined tendon was identified. Its lateral margin was dissected and a  self-retaining retractor inserted. Bursal tissues were removed to improve visualization. The subscapularis tendon was released from its attachment to the lesser tuberosity. The inferior capsule was released with care after identifying and protecting the axillary nerve.   The shoulder was dislocated and the humeral head removed from it's taper. Thin osteotomes were used around the metaphysis and the set screw loosened. The proximal body was then removed with minimal bone loss.  Attention was redirected to the glenoid. The prior cemented, pegged glenoid component was removed with a Cobb. The guidewire was drilled into the glenoid neck using the appropriate guide. After verifying its position, it was overreamed with the mini-baseplate reamer to create a flat surface. The peripheral reamer was then utilized. The permanent mini-baseplate was impacted into place. It was stabilized with 3 peripheral screws. Locking screws were placed superiorly and inferiorly while nonlocking screws was placed anteriorly. The permanent 4mm glenosphere was then impacted into place and the central screw tightened. The locking mechanism verified using manual distraction.  The size 1 centered epiphysis fit appropriately. This was left in place and a trial reduction performed using the standard trial humeral platform. The arm demonstrated excellent range of motion as the hand could be brought across the chest to the opposite shoulder and brought to the top of the patient's head and to the patient's ear. The shoulder appeared stable throughout this range of motion. The joint was dislocated and the trial components removed. The epiphysis and tray was impacted into place with care taken to maintain the appropriate version. The final insert was then impacted in to position. The shoulder was relocated using two finger pressure and again placed through a range of motion with the  findings as described above.  The wound was copiously irrigated  with betadine and saline solution. The deltopectoral interval was closed using #0 Vicryl interrupted sutures before the subcutaneous tissues were closed using 2-0 Vicryl interrupted sutures. The skin was closed using staples.  A sterile occlusive dressing was applied to the wound before the arm was placed into a shoulder sling. The patient was then transferred back to a hospital bed before being awakened, extubated, and returned to the recovery room in satisfactory condition after tolerating the procedure well.  Cassell Smiles, MD

## 2021-08-16 NOTE — Anesthesia Procedure Notes (Signed)
Procedure Name: Intubation Date/Time: 08/16/2021 12:49 PM Performed by: Lowry Bowl, CRNA Pre-anesthesia Checklist: Patient identified, Emergency Drugs available, Suction available and Patient being monitored Patient Re-evaluated:Patient Re-evaluated prior to induction Oxygen Delivery Method: Circle system utilized Preoxygenation: Pre-oxygenation with 100% oxygen Induction Type: IV induction and Cricoid Pressure applied Ventilation: Mask ventilation without difficulty and Oral airway inserted - appropriate to patient size Laryngoscope Size: McGraph and 4 Grade View: Grade I Tube type: Oral Tube size: 7.0 mm Number of attempts: 1 Airway Equipment and Method: Stylet and Video-laryngoscopy Placement Confirmation: ETT inserted through vocal cords under direct vision, positive ETCO2 and breath sounds checked- equal and bilateral Secured at: 22 cm Tube secured with: Tape Dental Injury: Teeth and Oropharynx as per pre-operative assessment

## 2021-08-16 NOTE — H&P (Signed)
The patient has been re-examined, and the chart reviewed, and there have been no interval changes to the documented history and physical.  Plan a right total shoulder conversion to reverse total shoulder today.  Anesthesia is consulted regarding a peripheral nerve block for post-operative pain.  The risks, benefits, and alternatives have been discussed at length, and the patient is willing to proceed.

## 2021-08-17 MED ORDER — DOCUSATE SODIUM 100 MG PO CAPS
ORAL_CAPSULE | ORAL | Status: AC
  Administered 2021-08-17: 100 mg via ORAL
  Filled 2021-08-17: qty 1

## 2021-08-17 MED ORDER — DOCUSATE SODIUM 100 MG PO CAPS: 100.0000 mg | ORAL_CAPSULE | Freq: Two times a day (BID) | ORAL | 0 refills | Status: AC

## 2021-08-17 MED ORDER — POTASSIUM CHLORIDE CRYS ER 20 MEQ PO TBCR
EXTENDED_RELEASE_TABLET | ORAL | Status: AC
  Administered 2021-08-17: 20 meq via ORAL
  Filled 2021-08-17: qty 1

## 2021-08-17 MED ORDER — HYDROCODONE-ACETAMINOPHEN 5-325 MG PO TABS: 1.0000 | ORAL_TABLET | ORAL | 0 refills | Status: AC | PRN

## 2021-08-17 MED ORDER — KETOROLAC TROMETHAMINE 15 MG/ML IJ SOLN
INTRAMUSCULAR | Status: AC
  Administered 2021-08-17: 7.5 mg via INTRAVENOUS
  Filled 2021-08-17: qty 1

## 2021-08-17 MED ORDER — CEFAZOLIN SODIUM-DEXTROSE 2-4 GM/100ML-% IV SOLN
INTRAVENOUS | Status: AC
  Administered 2021-08-17: 2 g via INTRAVENOUS
  Filled 2021-08-17: qty 100

## 2021-08-17 MED ORDER — HYDROCODONE-ACETAMINOPHEN 5-325 MG PO TABS
ORAL_TABLET | ORAL | Status: AC
  Filled 2021-08-17: qty 1

## 2021-08-17 NOTE — Discharge Instructions (Addendum)
Sling for comfort. Mid-line ADL's OK Call with fever>101.5 degrees, shortness of breath or drainage from wound [phone] Shower OK Follow-up as scheduled   Information for Discharge Teaching: EXPAREL (bupivacaine liposome injectable suspension)   Your surgeon or anesthesiologist gave you EXPAREL(bupivacaine) to help control your pain after surgery.  EXPAREL is a local anesthetic that provides pain relief by numbing the tissue around the surgical site. EXPAREL is designed to release pain medication over time and can control pain for up to 72 hours. Depending on how you respond to EXPAREL, you may require less pain medication during your recovery.  Possible side effects: Temporary loss of sensation or ability to move in the area where bupivacaine was injected. Nausea, vomiting, constipation Rarely, numbness and tingling in your mouth or lips, lightheadedness, or anxiety may occur. Call your doctor right away if you think you may be experiencing any of these sensations, or if you have other questions regarding possible side effects.  Follow all other discharge instructions given to you by your surgeon or nurse. Eat a healthy diet and drink plenty of water or other fluids.  If you return to the hospital for any reason within 96 hours following the administration of EXPAREL, it is important for health care providers to know that you have received this anesthetic. A teal colored band has been placed on your arm with the date, time and amount of EXPAREL you have received in order to alert and inform your health care providers. Please leave this armband in place for the full 96 hours following administration, and then you may remove the band.

## 2021-08-17 NOTE — Discharge Summary (Signed)
Physician Discharge Summary  Patient ID: Joel Mcintosh MRN: [id number] DOB/AGE: 12/22/1940 81 y.o.  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    He was given perioperative antibiotics:  Anti-infectives (From admission, onward)    Start     Dose/Rate Route Frequency Ordered Stop   08/16/21 2303  ceFAZolin (ANCEF) 2-4 GM/100ML-% IVPB       Note to Pharmacy: Mikey Bussing P: cabinet override      08/16/21 2303 08/16/21 2324   08/16/21 1854  ceFAZolin (ANCEF) 2-4 GM/100ML-% IVPB       Note to Pharmacy: Adele Dan C: cabinet override      08/16/21 1854 08/17/21 1856   08/16/21 1800  ceFAZolin (ANCEF) IVPB 2g/100 mL premix        2 g 200 mL/hr over 30 Minutes Intravenous Every 6 hours 08/16/21 1607 08/17/21 1013   08/16/21 1058  ceFAZolin (ANCEF) 2-4 GM/100ML-% IVPB       Note to Pharmacy: Christene Slates W: cabinet override      08/16/21 1058 08/16/21 1312   08/16/21 1045  ceFAZolin (ANCEF) IVPB 2g/100 mL premix        2 g 200 mL/hr over  30 Minutes Intravenous On call to O.R. 08/16/21 1038 08/16/21 1300     .  He was given sequential compression devices, early ambulation, and aspirin for DVT prophylaxis.  He benefited maximally from the hospital stay and there were no complications.    Recent vital signs:  Vitals:   08/17/21 0920 08/17/21 1200  BP: 138/72 124/62  Pulse: 65 82  Resp:  18  Temp:  98.8 F (37.1 C)  SpO2: 96% 99%    Recent laboratory studies:  Lab Results  Component Value Date   HGB 13.4 08/10/2021   HGB 13.0 09/12/2017   HGB 14.1 08/30/2017   Lab Results  Component Value Date   WBC 6.0 08/10/2021   PLT 179 08/10/2021   Lab Results  Component Value Date   INR 1.0 09/23/2019   Lab Results  Component Value Date   NA 141 08/10/2021   K 3.7 08/10/2021   CL 106 08/10/2021   CO2 30 08/10/2021   BUN 11 08/10/2021   CREATININE 0.61 08/10/2021   GLUCOSE 146 (H) 08/10/2021    Discharge Medications:   Allergies as of 08/17/2021   No Known Allergies      Medication List     TAKE these medications    amLODipine 10 MG tablet Commonly known as: NORVASC Take 10 mg by  mouth at bedtime.   aspirin EC 81 MG tablet Take 81 mg by mouth daily.   docusate sodium 100 MG capsule Commonly known as: COLACE Take 1 capsule (100 mg total) by mouth 2 (two) times daily.   doxazosin 8 MG tablet Commonly known as: CARDURA Take 8 mg by mouth at bedtime.   ferrous sulfate 325 (65 FE) MG EC tablet Take 325 mg by mouth 3 (three) times daily with meals.   Fish Oil 1000 MG Caps Take 1,000 mg by mouth 2 (two) times daily.   Garlic 10 MG Caps Take 10 mg by mouth daily.   HYDROcodone-acetaminophen 5-325 MG tablet Commonly known as: NORCO/VICODIN Take 1-2 tablets by mouth every 4 (four) hours as needed for moderate pain (pain score 4-6).   indomethacin 25 MG capsule Commonly known as: INDOCIN Take 25 mg by mouth daily as needed (gout flare).   losartan 100 MG tablet Commonly known as:  COZAAR Take 100 mg by mouth daily. AM   losartan-hydrochlorothiazide 100-25 MG tablet Commonly known as: HYZAAR Take 1 tablet by mouth daily.   magnesium oxide 400 MG tablet Commonly known as: MAG-OX Take 400 mg by mouth daily.   melatonin 3 MG Tabs tablet Take 3 mg by mouth at bedtime.   meloxicam 7.5 MG tablet Commonly known as: MOBIC Take 7.5 mg by mouth daily.   metoprolol succinate 25 MG 24 hr tablet Commonly known as: TOPROL-XL Take 25 mg by mouth daily.   potassium chloride SA 20 MEQ tablet Commonly known as: KLOR-CON M Take 20 mEq by mouth daily.   rosuvastatin 5 MG tablet Commonly known as: CRESTOR Take 5 mg by mouth daily.   sildenafil 100 MG tablet Commonly known as: VIAGRA Take 50-100 mg by mouth daily as needed for erectile dysfunction.   tamsulosin 0.4 MG Caps capsule Commonly known as: FLOMAX Take 0.4 mg by mouth daily.        Diagnostic Studies: DG Shoulder Right Port  Result Date: 08/16/2021 CLINICAL DATA:  Postop EXAM: RIGHT SHOULDER - 1 VIEW COMPARISON:  09/25/2019 FINDINGS: Reverse right shoulder arthroplasty with normal alignment. Gas in the soft tissues consistent with recent surgery. No fracture is seen. IMPRESSION: Right shoulder replacement with expected postsurgical change Electronically Signed   By: Jasmine Pang M.D.   On: 08/16/2021 16:09   [place] OR NERVE BLOCK-IMAGE ONLY [facility])  Result Date: 08/16/2021 There is no interpretation for this exam.  This order is for images obtained during a surgical procedure.  Please See "Surgeries" Tab for more information regarding the procedure.    Disposition: Discharge disposition: 01-Home or Self Care            Signed: Lyndle Herrlich ,MD 08/17/2021, 12:52 PM

## 2021-08-17 NOTE — Evaluation (Signed)
Occupational Therapy Evaluation Patient Details Name: Joel Mcintosh MRN: [id number] DOB: 1941/05/08 Today's Date: 08/17/2021   History of Present Illness Pt is 81 y/o s/p reverse total shoulder arthroplasty.   Clinical Impression   Upon entering the room, pt seated in recliner chair with son present in room. Pt is agreeable to OT evaluation and is pleasant and cooperative throughout. OT reviews precautions, sling use, and digit/wrist/elbow exercises. Pt returning demonstrations with min cuing for technique. Pt able to don/doff shoulder sling with increased time and min cuing for technique. Focus on self care tasks while maintaining precautions. Pt able to perform LB self care with min A and UB self care with cuing for technique. OT did recommend pull over shirt and pants at home for increased ease with self care tasks. Pt ambulates 250' without use of AD and without assistance. Son reports this is similar to baseline. OT to SIGN OFF at this time.      Recommendations for follow up therapy are one component of a multi-disciplinary discharge planning process, led by the attending physician.  Recommendations may be updated based on patient status, additional functional criteria and insurance authorization.   Follow Up Recommendations  Follow physician's recommendations for discharge plan and follow up therapies    Assistance Recommended at Discharge PRN        Equipment Recommendations  None recommended by OT       Precautions / Restrictions Precautions Precautions: Shoulder Shoulder Interventions: Shoulder sling/immobilizer;At all times;Off for dressing/bathing/exercises Precaution Comments: paper handout provided Restrictions Weight Bearing Restrictions: Yes RUE Weight Bearing: Non weight bearing      Mobility Bed Mobility Overal bed mobility: Modified Independent                  Transfers Overall transfer level: Modified independent                         Balance Overall balance assessment: No apparent balance deficits (not formally assessed)                                         ADL either performed or assessed with clinical judgement   ADL                                         General ADL Comments: Pt demonstrated ability to don/doff shoulder sling without assistance. LB dressing with min cuing for technique. LB dressing with min A to don B shoes and socks. OT recommended pt utilize pull over shirt and elastic waist pants for greater ease with self care at home.     Vision Patient Visual Report: No change from baseline              Pertinent Vitals/Pain Pain Assessment: No/denies pain     Hand Dominance Right   Extremity/Trunk Assessment Upper Extremity Assessment Upper Extremity Assessment: RUE deficits/detail RUE Deficits / Details: precautions secondary to surgical intervention RUE: Unable to fully assess due to immobilization   Lower Extremity Assessment Lower Extremity Assessment: Overall WFL for tasks assessed       Communication Communication Communication: No difficulties   Cognition Arousal/Alertness: Awake/alert Behavior During Therapy: WFL for tasks assessed/performed Overall Cognitive Status: Within Functional Limits for tasks assessed  Home Living Family/patient expects to be discharged to:: Private residence Living Arrangements: Alone Available Help at Discharge: Family;Available PRN/intermittently Type of Home: House Home Access: Stairs to enter [employer] of Steps: 2 Entrance Stairs-Rails: Right Home Layout: One level     Bathroom Shower/Tub: Chief Strategy Officer: Standard Bathroom Accessibility: Yes   Home Equipment: None          Prior Functioning/Environment Prior Level of Function : Independent/Modified Independent                                  OT Goals(Current goals can be found in the care plan section) Acute Rehab OT Goals Patient Stated Goal: to go home OT Goal Formulation: With patient/family Time For Goal Achievement: 08/17/21 Potential to Achieve Goals: Good  OT Frequency:         AM-PAC OT "6 Clicks" Daily Activity     Outcome Measure Help from another person eating meals?: None Help from another person taking care of personal grooming?: None Help from another person toileting, which includes using toliet, bedpan, or urinal?: None Help from another person bathing (including washing, rinsing, drying)?: None Help from another person to put on and taking off regular upper body clothing?: A Little Help from another person to put on and taking off regular lower body clothing?: A Little 6 Click Score: 22   End of Session Nurse Communication: Mobility status  Activity Tolerance: Patient tolerated treatment well Patient left: in bed;with family/visitor present                   Time: 5885-0277 OT Time Calculation (min): 29 min Charges:  OT General Charges $OT Visit: 1 Visit OT Evaluation $OT Eval Low Complexity: 1 Low OT Treatments $Self Care/Home Management : 23-37 mins  Jackquline Denmark, MS, OTR/L , CBIS ascom [phone]  08/17/21, 1:05 PM

## 2021-08-18 ENCOUNTER — Encounter: Payer: Self-pay | Admitting: Orthopedic Surgery

## 2021-08-19 NOTE — Interval H&P Note (Signed)
History and Physical Interval Note:  08/19/2021 6:52 AM  Joel Mcintosh  has presented today for surgery, with the diagnosis of Z96.611 Presence of right artificial shoulder joint.  The various methods of treatment have been discussed with the patient and family. After consideration of risks, benefits and other options for treatment, the patient has consented to  Procedure(s): TOTAL SHOULDER REVISION (Right) as a surgical intervention.  The patient's history has been reviewed, patient examined, no change in status, stable for surgery.  I have reviewed the patient's chart and labs.  Questions were answered to the patient's satisfaction.     Altamese Cabal

## 2021-08-21 NOTE — Anesthesia Postprocedure Evaluation (Signed)
Anesthesia Post Note  Patient: Joel Mcintosh  Procedure(s) Performed: TOTAL SHOULDER REVISION (Right: Shoulder)  Patient location during evaluation: PACU Anesthesia Type: General Level of consciousness: awake and alert Pain management: pain level controlled Vital Signs Assessment: post-procedure vital signs reviewed and stable Respiratory status: spontaneous breathing, nonlabored ventilation, respiratory function stable and patient connected to nasal cannula oxygen Cardiovascular status: blood pressure returned to baseline and stable Postop Assessment: no apparent nausea or vomiting Anesthetic complications: no   No notable events documented.   Last Vitals:  Vitals:   08/17/21 0920 08/17/21 1200  BP: 138/72 124/62  Pulse: 65 82  Resp:  18  Temp:  37.1 C  SpO2: 96% 99%    Last Pain:  Vitals:   08/17/21 1200  TempSrc:   PainSc: 0-No pain                 Lenard Simmer
